# Patient Record
Sex: Male | Born: 1955 | Race: Black or African American | Hispanic: No | Marital: Married | Smoking: Former smoker
Health system: Southern US, Community
[De-identification: ages and names within clinical notes are randomized; demographics above are authoritative.]

## PROBLEM LIST (undated history)

## (undated) DIAGNOSIS — M1990 Unspecified osteoarthritis, unspecified site: Secondary | ICD-10-CM

## (undated) DIAGNOSIS — C801 Malignant (primary) neoplasm, unspecified: Secondary | ICD-10-CM

## (undated) DIAGNOSIS — I639 Cerebral infarction, unspecified: Secondary | ICD-10-CM

## (undated) DIAGNOSIS — I10 Essential (primary) hypertension: Secondary | ICD-10-CM

## (undated) HISTORY — PX: NO PAST SURGERIES: SHX2092

## (undated) HISTORY — DX: Cerebral infarction, unspecified: I63.9

## (undated) HISTORY — DX: Essential (primary) hypertension: I10

---

## 1998-01-16 ENCOUNTER — Emergency Department (HOSPITAL_COMMUNITY): Admission: EM | Admit: 1998-01-16 | Discharge: 1998-01-17 | Payer: Self-pay | Admitting: Emergency Medicine

## 1998-01-19 ENCOUNTER — Encounter: Payer: Self-pay | Admitting: Emergency Medicine

## 1998-01-19 ENCOUNTER — Emergency Department (HOSPITAL_COMMUNITY): Admission: EM | Admit: 1998-01-19 | Discharge: 1998-01-19 | Payer: Self-pay | Admitting: Emergency Medicine

## 1998-01-22 ENCOUNTER — Encounter: Admission: RE | Admit: 1998-01-22 | Discharge: 1998-01-22 | Payer: Self-pay | Admitting: Internal Medicine

## 1998-02-05 ENCOUNTER — Encounter: Admission: RE | Admit: 1998-02-05 | Discharge: 1998-02-05 | Payer: Self-pay | Admitting: Internal Medicine

## 2002-12-12 ENCOUNTER — Emergency Department (HOSPITAL_COMMUNITY): Admission: EM | Admit: 2002-12-12 | Discharge: 2002-12-13 | Payer: Self-pay | Admitting: Emergency Medicine

## 2008-01-20 ENCOUNTER — Emergency Department (HOSPITAL_COMMUNITY): Admission: EM | Admit: 2008-01-20 | Discharge: 2008-01-20 | Payer: Self-pay | Admitting: Emergency Medicine

## 2009-01-08 ENCOUNTER — Emergency Department (HOSPITAL_COMMUNITY): Admission: EM | Admit: 2009-01-08 | Discharge: 2009-01-08 | Payer: Self-pay | Admitting: Emergency Medicine

## 2009-01-26 ENCOUNTER — Emergency Department (HOSPITAL_COMMUNITY): Admission: EM | Admit: 2009-01-26 | Discharge: 2009-01-26 | Payer: Self-pay | Admitting: Emergency Medicine

## 2010-04-18 LAB — POCT I-STAT, CHEM 8
BUN: 9 mg/dL (ref 6–23)
Calcium, Ion: 1.11 mmol/L — ABNORMAL LOW (ref 1.12–1.32)
Chloride: 104 mEq/L (ref 96–112)
Creatinine, Ser: 1.7 mg/dL — ABNORMAL HIGH (ref 0.4–1.5)
Glucose, Bld: 98 mg/dL (ref 70–99)
HCT: 47 % (ref 39.0–52.0)
Hemoglobin: 16 g/dL (ref 13.0–17.0)
Potassium: 3.9 mEq/L (ref 3.5–5.1)
Sodium: 141 mEq/L (ref 135–145)
TCO2: 30 mmol/L (ref 0–100)

## 2010-04-18 LAB — CBC
HCT: 43.6 % (ref 39.0–52.0)
Hemoglobin: 14.8 g/dL (ref 13.0–17.0)
MCHC: 33.9 g/dL (ref 30.0–36.0)
MCV: 86.2 fL (ref 78.0–100.0)
Platelets: 278 10*3/uL (ref 150–400)
RBC: 5.05 MIL/uL (ref 4.22–5.81)
RDW: 14.4 % (ref 11.5–15.5)
WBC: 8.8 10*3/uL (ref 4.0–10.5)

## 2010-04-18 LAB — PROTIME-INR
INR: 0.98 (ref 0.00–1.49)
Prothrombin Time: 12.9 seconds (ref 11.6–15.2)

## 2011-03-28 ENCOUNTER — Other Ambulatory Visit: Payer: Self-pay | Admitting: Family Medicine

## 2011-03-28 ENCOUNTER — Ambulatory Visit (INDEPENDENT_AMBULATORY_CARE_PROVIDER_SITE_OTHER): Payer: 59 | Admitting: Family Medicine

## 2011-03-28 VITALS — BP 133/93 | HR 64 | Temp 98.4°F | Resp 16 | Ht 69.5 in | Wt 263.0 lb

## 2011-03-28 DIAGNOSIS — I10 Essential (primary) hypertension: Secondary | ICD-10-CM

## 2011-03-28 DIAGNOSIS — N529 Male erectile dysfunction, unspecified: Secondary | ICD-10-CM

## 2011-03-28 MED ORDER — LOSARTAN POTASSIUM-HCTZ 100-25 MG PO TABS: 1.0000 | ORAL_TABLET | Freq: Every day | ORAL | Status: DC

## 2011-03-28 MED ORDER — VARDENAFIL HCL 20 MG PO TABS: 20.0000 mg | ORAL_TABLET | Freq: Every day | ORAL | Status: DC | PRN

## 2011-03-28 MED ORDER — AMLODIPINE BESYLATE 10 MG PO TABS: 10.0000 mg | ORAL_TABLET | Freq: Every day | ORAL | Status: DC

## 2011-03-28 NOTE — Patient Instructions (Signed)
Come in for complete physical in about August or September.  Eat less and start doing a daily walk to see if you can lose some weight.

## 2011-03-28 NOTE — Progress Notes (Signed)
Subjective:  Patient is here for a blood pressure recheck. No acute complaints. Needs a refill of his Levitra.  Objective no acute distress throat clear neck supple without nodes or thyromegaly. Chest clear. Heart regular without murmurs. No ankle edema.   Assessment: Hypertension   ED  Plan   meds same

## 2011-04-22 ENCOUNTER — Other Ambulatory Visit: Payer: Self-pay | Admitting: Family Medicine

## 2011-09-22 ENCOUNTER — Other Ambulatory Visit: Payer: Self-pay | Admitting: Family Medicine

## 2011-12-23 ENCOUNTER — Other Ambulatory Visit: Payer: Self-pay | Admitting: Physician Assistant

## 2012-01-01 ENCOUNTER — Ambulatory Visit: Payer: 59 | Admitting: Family Medicine

## 2012-01-01 VITALS — BP 139/83 | HR 82 | Temp 97.9°F | Resp 18 | Ht 69.5 in | Wt 258.0 lb

## 2012-01-01 DIAGNOSIS — I10 Essential (primary) hypertension: Secondary | ICD-10-CM

## 2012-01-01 DIAGNOSIS — M109 Gout, unspecified: Secondary | ICD-10-CM

## 2012-01-01 DIAGNOSIS — N529 Male erectile dysfunction, unspecified: Secondary | ICD-10-CM

## 2012-01-01 LAB — COMPREHENSIVE METABOLIC PANEL
Albumin: 4.3 g/dL (ref 3.5–5.2)
Alkaline Phosphatase: 78 U/L (ref 39–117)
BUN: 12 mg/dL (ref 6–23)
Creat: 1.5 mg/dL — ABNORMAL HIGH (ref 0.50–1.35)
Glucose, Bld: 100 mg/dL — ABNORMAL HIGH (ref 70–99)
Total Bilirubin: 1.1 mg/dL (ref 0.3–1.2)

## 2012-01-01 MED ORDER — VARDENAFIL HCL 20 MG PO TABS: 20.0000 mg | ORAL_TABLET | Freq: Every day | ORAL | Status: DC | PRN

## 2012-01-01 MED ORDER — PREDNISONE 20 MG PO TABS: ORAL_TABLET | ORAL | Status: DC

## 2012-01-01 MED ORDER — INDOMETHACIN 25 MG PO CAPS: 25.0000 mg | ORAL_CAPSULE | Freq: Two times a day (BID) | ORAL | Status: DC

## 2012-01-01 MED ORDER — LOSARTAN POTASSIUM-HCTZ 100-25 MG PO TABS: 1.0000 | ORAL_TABLET | Freq: Every day | ORAL | Status: DC

## 2012-01-01 MED ORDER — AMLODIPINE BESYLATE 10 MG PO TABS: 10.0000 mg | ORAL_TABLET | Freq: Every day | ORAL | Status: DC

## 2012-01-01 NOTE — Progress Notes (Signed)
Subjective: Patient has been having trouble with swelling and pain in his right foot. He has had this several times in the past. He does not know what is causing it. He has the high blood pressure and is on medicine for that. He also has ED and would like a refill of his Levitra.  Objective: No acute complaints. Blood pressure is noted. Chest clear. Heart regular without murmurs. His right foot has a lot of tenderness over the MCP joint of the large toe. There is swelling today.  Assessment: Gout Hypertension Erectile dysfunction  Plan: Refill the lower Levitra Indomethacin and a short course of prednisone. Continue blood pressure medication After he gets the results of his labs we will probably want to put him on allopurinol. He has never been treated for hyperuricemia.

## 2012-01-01 NOTE — Patient Instructions (Addendum)
Continue blood pressure medications  Use the Levitra has necessary. Often one half or even one fourth of a tablet may do the job.  Take the prednisone 2 pills every day for 3 days for the gout. Also begin indomethacin 25 mg every day at breakfast and supper. This is for gout also. Stop it if it upsets her stomach

## 2012-01-05 MED ORDER — ALLOPURINOL 100 MG PO TABS: 100.0000 mg | ORAL_TABLET | Freq: Two times a day (BID) | ORAL | Status: DC

## 2012-01-05 NOTE — Addendum Note (Signed)
Addended by: Johnnette Litter on: 01/05/2012 12:03 PM   Modules accepted: Orders

## 2012-01-09 ENCOUNTER — Other Ambulatory Visit: Payer: Self-pay | Admitting: *Deleted

## 2012-01-09 MED ORDER — ALLOPURINOL 100 MG PO TABS: ORAL_TABLET | ORAL | Status: DC

## 2012-01-14 ENCOUNTER — Other Ambulatory Visit: Payer: Self-pay | Admitting: Radiology

## 2012-05-17 ENCOUNTER — Ambulatory Visit: Payer: 59 | Admitting: Family Medicine

## 2012-05-17 ENCOUNTER — Ambulatory Visit: Payer: 59

## 2012-05-17 VITALS — BP 134/75 | HR 87 | Temp 99.1°F | Resp 16 | Ht 69.3 in | Wt 246.2 lb

## 2012-05-17 DIAGNOSIS — M25559 Pain in unspecified hip: Secondary | ICD-10-CM

## 2012-05-17 DIAGNOSIS — M25552 Pain in left hip: Secondary | ICD-10-CM

## 2012-05-17 DIAGNOSIS — M109 Gout, unspecified: Secondary | ICD-10-CM

## 2012-05-17 LAB — COMPREHENSIVE METABOLIC PANEL
AST: 21 U/L (ref 0–37)
Alkaline Phosphatase: 66 U/L (ref 39–117)
Glucose, Bld: 85 mg/dL (ref 70–99)
Sodium: 138 mEq/L (ref 135–145)
Total Bilirubin: 1.1 mg/dL (ref 0.3–1.2)
Total Protein: 8.1 g/dL (ref 6.0–8.3)

## 2012-05-17 LAB — URIC ACID: Uric Acid, Serum: 7.7 mg/dL (ref 4.0–7.8)

## 2012-05-17 MED ORDER — INDOMETHACIN 25 MG PO CAPS: 25.0000 mg | ORAL_CAPSULE | Freq: Two times a day (BID) | ORAL | Status: DC

## 2012-05-17 MED ORDER — PREDNISONE 20 MG PO TABS: ORAL_TABLET | ORAL | Status: DC

## 2012-05-17 NOTE — Patient Instructions (Addendum)
Take the medications as directed.  Return if not improving  We will let you know the results of your lab

## 2012-05-17 NOTE — Progress Notes (Addendum)
Subjective: Patient is here with left hip pain. He had no trauma. It started hurting about Tuesday. He got pretty bad. He is taking some Aleve and is doing a little bit better. He thinks his gout in the hip this time. He has been taking allopurinol which was begun last fall.  Objective: Walks with a little limp. He is tender in this left hip, primarily in the buttock.  Assessment: Left hip pain, probably gout  Plan: X-ray hip, uric acid level, C. Met Prednisone for 2 days Indomethacin  If symptoms get worse come back  Will let you know the results of your labs  Assessment: Acute gouty arthritis of hip  [facility] reading (PRIMARY) by  Dr. Alwyn Ren Minimal arthritic changes .

## 2012-05-23 ENCOUNTER — Telehealth: Payer: Self-pay

## 2012-05-23 MED ORDER — ALLOPURINOL 300 MG PO TABS: 300.0000 mg | ORAL_TABLET | Freq: Every day | ORAL | Status: DC

## 2012-05-23 NOTE — Telephone Encounter (Signed)
Pt CB and I gave him results of labs and Dr Hopper's instr's. Pt agreed. Sent in new Rx of allopurinol as ordered in lab result notes.

## 2012-05-23 NOTE — Telephone Encounter (Signed)
Lab has been unable to reach, letter was sent. Left message, this is his work # [email or web address]

## 2012-05-23 NOTE — Telephone Encounter (Signed)
Patient says he was advised to call our office about his lab results if he had not heard back from [place]. Please call at [phone].

## 2012-07-06 ENCOUNTER — Other Ambulatory Visit: Payer: Self-pay | Admitting: Family Medicine

## 2012-08-25 ENCOUNTER — Other Ambulatory Visit: Payer: Self-pay | Admitting: Family Medicine

## 2012-09-29 ENCOUNTER — Other Ambulatory Visit: Payer: Self-pay | Admitting: Family Medicine

## 2012-10-11 ENCOUNTER — Telehealth: Payer: Self-pay

## 2012-10-11 MED ORDER — LOSARTAN POTASSIUM-HCTZ 100-25 MG PO TABS: 1.0000 | ORAL_TABLET | Freq: Every day | ORAL | Status: DC

## 2012-10-11 NOTE — Telephone Encounter (Signed)
He needs follow up. What is his plan? He indicates he will come in, he knows he is due. 15 day supply sent until he can come in.

## 2012-10-11 NOTE — Telephone Encounter (Signed)
Patient needs a refill on Losartan. States that his pharmacy sent over an electronic request but they haven't heard anything. [employer]  [phone]

## 2012-10-27 ENCOUNTER — Ambulatory Visit: Payer: 59 | Admitting: Emergency Medicine

## 2012-10-27 VITALS — BP 126/74 | HR 83 | Temp 99.0°F | Resp 17 | Ht 70.0 in | Wt 262.0 lb

## 2012-10-27 DIAGNOSIS — I10 Essential (primary) hypertension: Secondary | ICD-10-CM

## 2012-10-27 DIAGNOSIS — M109 Gout, unspecified: Secondary | ICD-10-CM | POA: Insufficient documentation

## 2012-10-27 DIAGNOSIS — R0683 Snoring: Secondary | ICD-10-CM

## 2012-10-27 MED ORDER — LOSARTAN POTASSIUM-HCTZ 100-25 MG PO TABS: ORAL_TABLET | ORAL | Status: DC

## 2012-10-27 MED ORDER — AMLODIPINE BESYLATE 10 MG PO TABS: ORAL_TABLET | ORAL | Status: DC

## 2012-10-27 NOTE — Progress Notes (Signed)
  Subjective:    Patient ID: John Schaefer, male    DOB: Nov 08, 1955, 57 y.o.   MRN: [id number]  HPI  57 y.o. Male presents to clinic today for check up on BP; told he needed to return for visit for further refills.. Was given 2 weeks at pharmacy. Denies any chest pain , SOB. Needing refills on losartan and amlodipine. BP taken in exam room: 124/84   Refused flu vaccine.  Review of Systems     Objective:   Physical Exam  HEENT exam neck is supple. There is a large keloid on the left. Chest is clear to auscultation and percussion. There is a 2/6 systolic murmur at the base of the heart and along the upper left sternal border. Abdomen is soft nontender      Assessment & Plan:  Bmet was done today and that his last potassium was low at 3.3. We'll go ahead and refill his medication. He declined a flu shot today. He did agree to return to clinic this fall for a physical exam and will schedule this.

## 2012-10-28 LAB — BASIC METABOLIC PANEL
Chloride: 97 mEq/L (ref 96–112)
Potassium: 3.6 mEq/L (ref 3.5–5.3)

## 2012-10-28 LAB — URIC ACID: Uric Acid, Serum: 6 mg/dL (ref 4.0–7.8)

## 2012-10-29 ENCOUNTER — Other Ambulatory Visit: Payer: Self-pay | Admitting: Radiology

## 2012-10-29 DIAGNOSIS — R0683 Snoring: Secondary | ICD-10-CM

## 2012-12-03 ENCOUNTER — Emergency Department (HOSPITAL_COMMUNITY): Payer: 59

## 2012-12-03 ENCOUNTER — Emergency Department (HOSPITAL_COMMUNITY)
Admission: EM | Admit: 2012-12-03 | Discharge: 2012-12-03 | Disposition: A | Discharge status: Home / Self Care | Payer: 59 | Attending: Emergency Medicine | Admitting: Emergency Medicine

## 2012-12-03 ENCOUNTER — Encounter (HOSPITAL_COMMUNITY): Payer: Self-pay | Admitting: Emergency Medicine

## 2012-12-03 ENCOUNTER — Ambulatory Visit: Payer: 59 | Admitting: Emergency Medicine

## 2012-12-03 VITALS — BP 128/82 | HR 104 | Temp 100.2°F | Resp 18 | Ht 70.0 in | Wt 260.0 lb

## 2012-12-03 DIAGNOSIS — N453 Epididymo-orchitis: Secondary | ICD-10-CM | POA: Insufficient documentation

## 2012-12-03 DIAGNOSIS — R300 Dysuria: Secondary | ICD-10-CM

## 2012-12-03 DIAGNOSIS — N451 Epididymitis: Secondary | ICD-10-CM

## 2012-12-03 DIAGNOSIS — N50819 Testicular pain, unspecified: Secondary | ICD-10-CM

## 2012-12-03 DIAGNOSIS — Z792 Long term (current) use of antibiotics: Secondary | ICD-10-CM | POA: Insufficient documentation

## 2012-12-03 DIAGNOSIS — Z8673 Personal history of transient ischemic attack (TIA), and cerebral infarction without residual deficits: Secondary | ICD-10-CM | POA: Insufficient documentation

## 2012-12-03 DIAGNOSIS — N320 Bladder-neck obstruction: Secondary | ICD-10-CM

## 2012-12-03 DIAGNOSIS — N509 Disorder of male genital organs, unspecified: Secondary | ICD-10-CM

## 2012-12-03 DIAGNOSIS — I10 Essential (primary) hypertension: Secondary | ICD-10-CM | POA: Insufficient documentation

## 2012-12-03 DIAGNOSIS — A419 Sepsis, unspecified organism: Secondary | ICD-10-CM

## 2012-12-03 DIAGNOSIS — N390 Urinary tract infection, site not specified: Secondary | ICD-10-CM

## 2012-12-03 DIAGNOSIS — Z79899 Other long term (current) drug therapy: Secondary | ICD-10-CM | POA: Insufficient documentation

## 2012-12-03 DIAGNOSIS — Z87891 Personal history of nicotine dependence: Secondary | ICD-10-CM | POA: Insufficient documentation

## 2012-12-03 LAB — POCT CBC
Granulocyte percent: 83.7 %G — AB (ref 37–80)
Hemoglobin: 15.4 g/dL (ref 14.1–18.1)
Lymph, poc: 2.4 (ref 0.6–3.4)
MCHC: 31.5 g/dL — AB (ref 31.8–35.4)
MPV: 9.4 fL (ref 0–99.8)
POC Granulocyte: 21 — AB (ref 2–6.9)
POC LYMPH PERCENT: 9.4 %L — AB (ref 10–50)
POC MID %: 6.9 %M (ref 0–12)
Platelet Count, POC: 298 10*3/uL (ref 142–424)
RDW, POC: 14.7 %

## 2012-12-03 LAB — COMPREHENSIVE METABOLIC PANEL
ALT: 39 U/L (ref 0–53)
AST: 28 U/L (ref 0–37)
Albumin: 3.4 g/dL — ABNORMAL LOW (ref 3.5–5.2)
Alkaline Phosphatase: 97 U/L (ref 39–117)
BUN: 13 mg/dL (ref 6–23)
Calcium: 9.5 mg/dL (ref 8.4–10.5)
Chloride: 97 mEq/L (ref 96–112)
GFR calc Af Amer: 59 mL/min — ABNORMAL LOW (ref >90)
Potassium: 3.8 mEq/L (ref 3.5–5.1)
Sodium: 135 mEq/L (ref 135–145)
Total Bilirubin: 2.2 mg/dL — ABNORMAL HIGH (ref 0.3–1.2)
Total Protein: 8.3 g/dL (ref 6.0–8.3)

## 2012-12-03 LAB — POCT URINALYSIS DIPSTICK
Bilirubin, UA: NEGATIVE
Glucose, UA: NEGATIVE
Nitrite, UA: POSITIVE

## 2012-12-03 LAB — POCT UA - MICROSCOPIC ONLY

## 2012-12-03 LAB — CG4 I-STAT (LACTIC ACID): Lactic Acid, Venous: 1.65 mmol/L (ref 0.5–2.2)

## 2012-12-03 MED ORDER — DEXTROSE 5 % IV SOLN
1.0000 g | Freq: Once | INTRAVENOUS | Status: AC
  Administered 2012-12-03: 1 g via INTRAVENOUS
  Filled 2012-12-03: qty 10

## 2012-12-03 MED ORDER — DOXYCYCLINE HYCLATE 100 MG PO CAPS: 100.0000 mg | ORAL_CAPSULE | Freq: Two times a day (BID) | ORAL | Status: DC

## 2012-12-03 MED ORDER — CLINDAMYCIN PHOSPHATE 600 MG/50ML IV SOLN
600.0000 mg | Freq: Once | INTRAVENOUS | Status: AC
  Administered 2012-12-03: 600 mg via INTRAVENOUS
  Filled 2012-12-03: qty 50

## 2012-12-03 MED ORDER — HYDROMORPHONE HCL PF 1 MG/ML IJ SOLN
1.0000 mg | Freq: Once | INTRAMUSCULAR | Status: AC
  Administered 2012-12-03: 1 mg via INTRAVENOUS
  Filled 2012-12-03: qty 1

## 2012-12-03 MED ORDER — SODIUM CHLORIDE 0.9 % IV BOLUS (SEPSIS)
1000.0000 mL | Freq: Once | INTRAVENOUS | Status: AC
  Administered 2012-12-03: 1000 mL via INTRAVENOUS

## 2012-12-03 MED ORDER — LEVOFLOXACIN IN D5W 750 MG/150ML IV SOLN: 750.0000 mg | Freq: Once | INTRAVENOUS | Status: DC

## 2012-12-03 MED ORDER — SULFAMETHOXAZOLE-TRIMETHOPRIM 800-160 MG PO TABS: 1.0000 | ORAL_TABLET | Freq: Two times a day (BID) | ORAL | Status: AC

## 2012-12-03 MED ORDER — HYDROCODONE-ACETAMINOPHEN 5-325 MG PO TABS: 2.0000 | ORAL_TABLET | ORAL | Status: DC | PRN

## 2012-12-03 NOTE — ED Notes (Signed)
PT given d/c instructions and verbalized understanding. NAD at this time.

## 2012-12-03 NOTE — ED Notes (Signed)
Pt returned from ultrasound

## 2012-12-03 NOTE — ED Notes (Signed)
Pt on the phone

## 2012-12-03 NOTE — Progress Notes (Signed)
[facility] [address], [place] [address] [phone]  Date:  12/03/2012   Name:  John Schaefer   DOB:  1955-07-25   MRN:  [id number]  PCP:  Janace Hoard, MD    Chief Complaint: genital soreness   History of Present Illness:  John Schaefer is a 57 y.o. very pleasant male patient who presents with the following:  Sudden onset of pain in testicles yesterday with urge incontinence.  No fever or chills.  Has frequent small voids and now his urine is bloody.  Has history of prior prostate enlargement.  No nausea or vomiting.  No discharge.  No improvement with over the counter medications or other home remedies.   Patient Active Problem List   Diagnosis Date Noted  . Unspecified essential hypertension 10/27/2012  . Gout 10/27/2012    Past Medical History  Diagnosis Date  . Stroke   . Blood transfusion without reported diagnosis   . Hypertension     History reviewed. No pertinent past surgical history.  History  Substance Use Topics  . Smoking status: Former Games developer  . Smokeless tobacco: Not on file  . Alcohol Use: No    History reviewed. No pertinent family history.  No Known Allergies  Medication list has been reviewed and updated.  Current Outpatient Prescriptions on File Prior to Visit  Medication Sig Dispense Refill  . allopurinol (ZYLOPRIM) 100 MG tablet Take 2 tablets (200 mg total) by mouth daily.  60 tablet  2  . amLODipine (NORVASC) 10 MG tablet TAKE ONE TABLET BY MOUTH EVERY DAY  30 tablet  5  . indomethacin (INDOCIN) 25 MG capsule Take 1 capsule (25 mg total) by mouth 2 (two) times daily with a meal.  30 capsule  1  . losartan-hydrochlorothiazide (HYZAAR) 100-25 MG per tablet Take one tablet daily  30 tablet  5  . predniSONE (DELTASONE) 20 MG tablet Take 2 daily for 3 days for gout  6 tablet  0  . tadalafil (CIALIS) 5 MG tablet Take 5 mg by mouth daily as needed.      . vardenafil (LEVITRA) 20 MG tablet Take 1 tablet (20 mg total)  by mouth daily as needed.  3 tablet  5   No current facility-administered medications on file prior to visit.    Review of Systems:  As per HPI, otherwise negative.    Physical Examination: Filed Vitals:   12/03/12 1516  BP: 128/82  Pulse: 104  Temp: 100.2 F (37.9 C)  Resp: 18   Filed Vitals:   12/03/12 1516  Height: 5\' 10"  (1.778 m)  Weight: 260 lb (117.935 kg)   Body mass index is 37.31 kg/(m^2). Ideal Body Weight: Weight in (lb) to have BMI = 25: 173.9   GEN: WDWN, NAD, Non-toxic, Alert & Oriented x 3 HEENT: Atraumatic, Normocephalic.  Ears and Nose: No external deformity. EXTR: No clubbing/cyanosis/edema NEURO: Normal gait.  PSYCH: Normally interactive. Conversant. Not depressed or anxious appearing.  Calm demeanor.  Genitalia:  Normal male.  Has bloody urine draining.  Testes tender bilaterally but not enlarged and normal epididymis bilaterally  Assessment and Plan: Urinary outlet obstruction Urinary sepsis   Signed,  Phillips Odor, MD  Results for orders placed in visit on 12/03/12  POCT URINALYSIS DIPSTICK      Result Value Range   Color, UA orange     Clarity, UA cloudy     Glucose, UA neg     Bilirubin, UA neg  Ketones, UA neg     Spec Grav, UA 1.020     Blood, UA large     pH, UA 7.0     Protein, UA >300     Urobilinogen, UA 4.0     Nitrite, UA pos     Leukocytes, UA large (3+)    POCT CBC      Result Value Range   WBC 25.1 (*) 4.6 - 10.2 K/uL   Lymph, poc 2.4  0.6 - 3.4   POC LYMPH PERCENT 9.4 (*) 10 - 50 %L   MID (cbc) 1.7 (*) 0 - 0.9   POC MID % 6.9  0 - 12 %M   POC Granulocyte 21.0 (*) 2 - 6.9   Granulocyte percent 83.7 (*) 37 - 80 %G   RBC 5.35  4.69 - 6.13 M/uL   Hemoglobin 15.4  14.1 - 18.1 g/dL   HCT, POC 16.1  09.6 - 53.7 %   MCV 91.4  80 - 97 fL   MCH, POC 28.8  27 - 31.2 pg   MCHC 31.5 (*) 31.8 - 35.4 g/dL   RDW, POC 04.5     Platelet Count, POC 298  142 - 424 K/uL   MPV 9.4  0 - 99.8 fL

## 2012-12-03 NOTE — ED Notes (Addendum)
Presents with 2 days of urinary frequency, urgency, dysuria and today began urinating blood. Went to ucc and told to come directly here due to WBC of 25 to rule out urosepsis.  Alert, oreinted, afebrile, VSS. Reports pain in groin and penis.

## 2012-12-03 NOTE — ED Provider Notes (Signed)
CSN: [id number]     Arrival date & time 12/03/12  1708 History   First MD Initiated Contact with Patient 12/03/12 1728     Chief Complaint  Patient presents with  . Hematuria   (Consider location/radiation/quality/duration/timing/severity/associated sxs/prior Treatment) The history is provided by the patient. No language interpreter was used.  Patient is a 57 old [place] American male with no pertinent past medical history comes emergency department stay with testicular pain, dysuria, frequency, and urgency for the past 24 hours. He initially went to his primary care physician today for evaluation. His primary care visit to obtain a UA and a CBC. CBC had WBC of 25 and UA. Act as result they sent him to the emergency department for further evaluation. Patient has not had any fevers. He has had chills. Been eating and drinking normally. His only complaint is testicular pain which rates at an 8/10.    Past Medical History  Diagnosis Date  . Stroke   . Blood transfusion without reported diagnosis   . Hypertension    History reviewed. No pertinent past surgical history. History reviewed. No pertinent family history. History  Substance Use Topics  . Smoking status: Former Games developer  . Smokeless tobacco: Not on file  . Alcohol Use: No    Review of Systems  Constitutional: Positive for chills. Negative for fever.  Respiratory: Negative for cough and shortness of breath.   Genitourinary: Positive for dysuria, urgency, frequency and testicular pain. Negative for decreased urine volume, penile swelling, scrotal swelling and penile pain.  Musculoskeletal: Negative for back pain, neck pain and neck stiffness.  Skin: Negative for color change and wound.  All other systems reviewed and are negative.    Allergies  Review of patient's allergies indicates no known allergies.  Home Medications   Current Outpatient Rx  Name  Route  Sig  Dispense  Refill  . allopurinol (ZYLOPRIM) 300 MG tablet    Oral   Take 300 mg by mouth daily.         Marland Kitchen amLODipine (NORVASC) 10 MG tablet      TAKE ONE TABLET BY MOUTH EVERY DAY   30 tablet   5   . indomethacin (INDOCIN) 25 MG capsule   Oral   Take 1 capsule (25 mg total) by mouth 2 (two) times daily with a meal.   30 capsule   1   . losartan-hydrochlorothiazide (HYZAAR) 100-25 MG per tablet   Oral   Take 1 tablet by mouth daily. Take one tablet daily         . vardenafil (LEVITRA) 20 MG tablet   Oral   Take 20 mg by mouth daily as needed for erectile dysfunction.         Marland Kitchen doxycycline (VIBRAMYCIN) 100 MG capsule   Oral   Take 1 capsule (100 mg total) by mouth 2 (two) times daily.   20 capsule   0   . HYDROcodone-acetaminophen (NORCO/VICODIN) 5-325 MG per tablet   Oral   Take 2 tablets by mouth every 4 (four) hours as needed.   30 tablet   0   . sulfamethoxazole-trimethoprim (BACTRIM DS,SEPTRA DS) 800-160 MG per tablet   Oral   Take 1 tablet by mouth 2 (two) times daily.   60 tablet   0    BP 149/80  Pulse 99  Temp(Src) 99 F (37.2 C) (Oral)  Resp 18  Wt 260 lb 1 oz (117.964 kg)  SpO2 93% Physical Exam  Nursing note and vitals  reviewed. Constitutional: He is oriented to person, place, and time. He appears well-developed and well-nourished. No distress.  HENT:  Head: Normocephalic and atraumatic.  Eyes: Pupils are equal, round, and reactive to light.  Neck: Normal range of motion.  Cardiovascular: Normal rate, regular rhythm and normal heart sounds.   Pulmonary/Chest: Effort normal and breath sounds normal. No respiratory distress. He has no decreased breath sounds. He has no wheezes. He has no rhonchi. He has no rales.  Abdominal: Soft. He exhibits no distension. There is no tenderness. There is no rebound and no guarding. Hernia confirmed negative in the right inguinal area and confirmed negative in the left inguinal area.  Genitourinary: Right testis shows tenderness. Right testis shows no mass and no  swelling. Right testis is descended. Cremasteric reflex is not absent on the right side. Left testis shows tenderness. Left testis shows no mass and no swelling. Left testis is descended. Cremasteric reflex is not absent on the left side.  Warmth to bilateral testicles.  Erythema to bilateral testicles.  No induration, fluctuance, or crepitus to the scrotal sac.    Musculoskeletal: He exhibits no edema and no tenderness.  Neurological: He is alert and oriented to person, place, and time. He exhibits normal muscle tone.  Skin: Skin is warm and dry.    ED Course  Procedures (including critical care time) Labs Review Labs Reviewed  COMPREHENSIVE METABOLIC PANEL - Abnormal; Notable for the following:    Glucose, Bld 128 (*)    Creatinine, Ser 1.49 (*)    Albumin 3.4 (*)    Total Bilirubin 2.2 (*)    GFR calc non Af Amer 51 (*)    GFR calc Af Amer 59 (*)    All other components within normal limits  CULTURE, BLOOD (ROUTINE X 2)  CULTURE, BLOOD (ROUTINE X 2)  URINE CULTURE  GC/CHLAMYDIA PROBE AMP  CG4 I-STAT (LACTIC ACID)   Imaging Review US Scrotum  12/03/2012   CLINICAL DATA:  Bilateral testicular pain  EXAM: SCROTAL ULTRASOUND  DOPPLER ULTRASOUND OF THE TESTICLES  TECHNIQUE: Complete ultrasound examination of the testicles, epididymis, and other scrotal structures was performed. Color and spectral Doppler ultrasound were also utilized to evaluate blood flow to the testicles.  COMPARISON:  None.  FINDINGS: Right testicle  Measurements: 4.7 x 3.2 x 2.8 cm. No mass or microlithiasis visualized.  Left testicle  Measurements: 3.7 x 3.3 x 3.2 cm. No mass or microlithiasis visualized.  No intratesticular abnormality or difference in echogenicity is identified.  Right epididymis: Tiny sub cm simple appearing epididymal cysts are incidentally noted.  Left epididymis: Enlarged and inhomogeneous, with increased internal color flow, measuring overall 3.5 x 3.8 x 2.4 cm. Tiny sub cm simple appearing cyst  are incidentally noted.  Hydrocele:  None visualized.  Varicocele:  Small left varicocele  Pulsed Doppler interrogation of both testes demonstrates low resistance arterial and venous waveforms bilaterally.  IMPRESSION: Left epididymitis.  No sonographic evidence for testicular mass or torsion.   Electronically Signed   By: Christiana Pellant M.D.   On: 12/03/2012 20:12   [place] Art/ven Flow Abd Pelv Doppler Limited  12/03/2012   CLINICAL DATA:  Bilateral testicular pain  EXAM: SCROTAL ULTRASOUND  DOPPLER ULTRASOUND OF THE TESTICLES  TECHNIQUE: Complete ultrasound examination of the testicles, epididymis, and other scrotal structures was performed. Color and spectral Doppler ultrasound were also utilized to evaluate blood flow to the testicles.  COMPARISON:  None.  FINDINGS: Right testicle  Measurements: 4.7 x 3.2 x 2.8  cm. No mass or microlithiasis visualized.  Left testicle  Measurements: 3.7 x 3.3 x 3.2 cm. No mass or microlithiasis visualized.  No intratesticular abnormality or difference in echogenicity is identified.  Right epididymis: Tiny sub cm simple appearing epididymal cysts are incidentally noted.  Left epididymis: Enlarged and inhomogeneous, with increased internal color flow, measuring overall 3.5 x 3.8 x 2.4 cm. Tiny sub cm simple appearing cyst are incidentally noted.  Hydrocele:  None visualized.  Varicocele:  Small left varicocele  Pulsed Doppler interrogation of both testes demonstrates low resistance arterial and venous waveforms bilaterally.  IMPRESSION: Left epididymitis.  No sonographic evidence for testicular mass or torsion.   Electronically Signed   By: Christiana Pellant M.D.   On: 12/03/2012 20:12    EKG Interpretation   None       MDM  Patient is a 57 year-old [place] American male who comes to the emergency department today with testicular pain. Physical exam as above. Exam is concerning for an epididymitis. Doubt a testicular torsion based on exam. Initial workup included a lactic  acid, CMP, blood cultures, and testicular ultrasound. Patient was treated with Rocephin and clindamycin in the emergency department. There is erythema to the scrotum concerning for possible cellulitis versus Fournier's of the scrotum. Ultrasound demonstrated a left epididymitis. Care was discussed with urology they felt that erythema and warmth to the scrotum was normal with a epididymitis. They felt that since there is no induration, fluctuance, or crepitus to the scrotal sac it is unlikely the patient's suffering from cellulitis or Fournier's. They recommended that a postvoid residual be obtained and that the patient be started on Flomax if he has a postvoid residual greater than 150. Post void residual was 80.  Tachycardia an elevated WBC is concerning for sepsis. Patient's vital signs are repeated after the pain was controlled. Demonstrated a pulse in the 80s. He remained stable in the emergency department. Patient was nontoxic-appearing. With normal vital signs. Doubt sepsis. Patient was felt to be stable for discharge. He has had multiple sexual partners. As results was felt to require treatment for STDs and gram-negative rods. Neurology recommended treating with Bactrim for a month for gram-negative rods. Patient was instructed to followup with his primary care physician within the next 2-3 days. He was instructed to return to emergency department if he develops worsening pain, fevers, or any other concerns. Patient expressed understanding. He was discharged in stable condition.  His care was discussed with my attending Dr. Manus Gunning.    1. Epididymitis        Bethann Berkshire, MD 12/03/12 2119

## 2012-12-04 LAB — URINE CULTURE: Culture: NO GROWTH

## 2012-12-04 LAB — GC/CHLAMYDIA PROBE AMP: GC Probe RNA: NEGATIVE

## 2012-12-04 NOTE — ED Provider Notes (Signed)
I saw and evaluated the patient, reviewed the resident's note and I agree with the findings and plan. If applicable, I agree with the resident's interpretation of the EKG.  If applicable, I was present for critical portions of any procedures performed.  Bilateral testicular pain with dysuria, urgency.  No abdominal pain, vomiting, fever, trauma. Bilateral testicles warm and tender.  No induration or fluctuance. D/w urology who agrees with treatment for epididymitis.  No torsion.  No evidence of Fournier's gangrene.  Glynn Octave, MD 12/04/12 [phone]

## 2012-12-09 LAB — CULTURE, BLOOD (ROUTINE X 2)
Culture: NO GROWTH
Culture: NO GROWTH

## 2013-01-04 ENCOUNTER — Other Ambulatory Visit: Payer: Self-pay | Admitting: Family Medicine

## 2013-02-08 ENCOUNTER — Other Ambulatory Visit: Payer: Self-pay | Admitting: Family Medicine

## 2013-05-03 ENCOUNTER — Other Ambulatory Visit: Payer: Self-pay | Admitting: Emergency Medicine

## 2013-05-04 NOTE — Telephone Encounter (Signed)
Needs office visit.

## 2013-05-17 ENCOUNTER — Other Ambulatory Visit: Payer: Self-pay | Admitting: Emergency Medicine

## 2013-05-21 ENCOUNTER — Other Ambulatory Visit: Payer: Self-pay | Admitting: Emergency Medicine

## 2013-06-06 ENCOUNTER — Other Ambulatory Visit: Payer: Self-pay | Admitting: Emergency Medicine

## 2013-06-06 ENCOUNTER — Other Ambulatory Visit: Payer: Self-pay | Admitting: Physician Assistant

## 2013-07-10 ENCOUNTER — Other Ambulatory Visit: Payer: Self-pay | Admitting: Physician Assistant

## 2013-07-12 ENCOUNTER — Other Ambulatory Visit: Payer: Self-pay | Admitting: Physician Assistant

## 2013-07-14 ENCOUNTER — Other Ambulatory Visit: Payer: Self-pay | Admitting: Physician Assistant

## 2013-07-14 NOTE — Telephone Encounter (Signed)
Pt is aware that he will need an OV for additional refills on BP medication, he would like to know if he could have enough called in to last him until he is able to come in Saturday 07/19/13. Best# [phone]

## 2013-07-20 ENCOUNTER — Ambulatory Visit (INDEPENDENT_AMBULATORY_CARE_PROVIDER_SITE_OTHER): Payer: 59 | Admitting: Family Medicine

## 2013-07-20 VITALS — BP 138/90 | HR 85 | Temp 98.2°F | Resp 18 | Ht 69.0 in | Wt 264.0 lb

## 2013-07-20 DIAGNOSIS — I10 Essential (primary) hypertension: Secondary | ICD-10-CM

## 2013-07-20 DIAGNOSIS — M1A00X0 Idiopathic chronic gout, unspecified site, without tophus (tophi): Secondary | ICD-10-CM

## 2013-07-20 DIAGNOSIS — Z79899 Other long term (current) drug therapy: Secondary | ICD-10-CM

## 2013-07-20 DIAGNOSIS — R011 Cardiac murmur, unspecified: Secondary | ICD-10-CM

## 2013-07-20 MED ORDER — LOSARTAN POTASSIUM-HCTZ 100-25 MG PO TABS: 1.0000 | ORAL_TABLET | Freq: Every day | ORAL | Status: DC

## 2013-07-20 MED ORDER — AMLODIPINE BESYLATE 10 MG PO TABS: 10.0000 mg | ORAL_TABLET | Freq: Every day | ORAL | Status: DC

## 2013-07-20 MED ORDER — ALLOPURINOL 300 MG PO TABS: 300.0000 mg | ORAL_TABLET | Freq: Every day | ORAL | Status: DC

## 2013-07-20 NOTE — Progress Notes (Signed)
Subjective:    Patient ID: John Schaefer, male    DOB: 1955-09-27, 58 y.o.   MRN: [id number] Chief Complaint  Patient presents with  . Medication Refill    Amlodipine, losartin    HPI Checks BP outside office and about the same   Pt is upset because he ran out of BP medication a wk ago and he called here and his pharmacy mult times and they didn't get refilled to last him until he can get in for a visit even though someone told him they would.  Works so has a hard time getting in. No h/o murmur.   Past Medical History  Diagnosis Date  . Stroke   . Blood transfusion without reported diagnosis   . Hypertension    Current Outpatient Prescriptions on File Prior to Visit  Medication Sig Dispense Refill  . indomethacin (INDOCIN) 25 MG capsule Take 1 capsule (25 mg total) by mouth 2 (two) times daily with a meal.  30 capsule  1   No current facility-administered medications on file prior to visit.   No Known Allergies   Review of Systems  Constitutional: Negative for fever and chills.  Eyes: Negative for visual disturbance.  Respiratory: Negative for apnea, chest tightness and shortness of breath.        No orthopnea or PND.  Cardiovascular: Negative for chest pain, palpitations and leg swelling.  Neurological: Negative for dizziness, syncope, facial asymmetry, weakness, light-headedness and headaches.  Psychiatric/Behavioral: Negative for sleep disturbance.      BP 138/90  Pulse 85  Temp(Src) 98.2 F (36.8 C)  Resp 18  Ht 5\' 9"  (1.753 m)  Wt 264 lb (119.75 kg)  BMI 38.97 kg/m2  SpO2 95% Objective:   Physical Exam  Constitutional: He is oriented to person, place, and time. He appears well-developed and well-nourished. No distress.  HENT:  Head: Normocephalic and atraumatic.  Eyes: Conjunctivae are normal. Pupils are equal, round, and reactive to light. No scleral icterus.  Neck: Normal range of motion. Neck supple. No thyromegaly present.  Cardiovascular: Normal rate,  regular rhythm and intact distal pulses.   Murmur heard.  Decrescendo systolic murmur is present with a grade of 2/6  RUSB 2+ pitting edema bilateral, no sacral edema  Pulmonary/Chest: Effort normal and breath sounds normal. No respiratory distress.  Lymphadenopathy:    He has no cervical adenopathy.  Neurological: He is alert and oriented to person, place, and time.  Skin: Skin is warm and dry. He is not diaphoretic.  Psychiatric: He has a normal mood and affect. His behavior is normal.       Assessment & Plan:   Unspecified essential hypertension - restart amlodipine and losartan-hctz. Pt is overdue for fasting labs and CPE so rec f/u OV for this and to recheck BP.  Idiopathic chronic gout of right foot without tophus - Plan: Uric Acid - refill allopurinol  Encounter for long-term (current) use of other medications - Plan: Basic metabolic panel  Undiagnosed cardiac murmurs - new w/ pedal edema but asymptomatic - recheck at f/u and refer for echo is persits. RTC immed if sxs.  Meds ordered this encounter  Medications  . amLODipine (NORVASC) 10 MG tablet    Sig: Take 1 tablet (10 mg total) by mouth daily.    Dispense:  90 tablet    Refill:  1  . losartan-hydrochlorothiazide (HYZAAR) 100-25 MG per tablet    Sig: Take 1 tablet by mouth daily.    Dispense:  90  tablet    Refill:  1  . allopurinol (ZYLOPRIM) 300 MG tablet    Sig: Take 1 tablet (300 mg total) by mouth daily.    Dispense:  90 tablet    Refill:  1    Delman Cheadle, MD MPH

## 2013-07-20 NOTE — Patient Instructions (Signed)
Hypertension Hypertension, commonly called high blood pressure, is when the force of blood pumping through your arteries is too strong. Your arteries are the blood vessels that carry blood from your heart throughout your body. A blood pressure reading consists of a higher number over a lower number, such as 110/72. The higher number (systolic) is the pressure inside your arteries when your heart pumps. The lower number (diastolic) is the pressure inside your arteries when your heart relaxes. Ideally you want your blood pressure below 120/80. Hypertension forces your heart to work harder to pump blood. Your arteries may become narrow or stiff. Having hypertension puts you at risk for heart disease, stroke, and other problems.  RISK FACTORS Some risk factors for high blood pressure are controllable. Others are not.  Risk factors you cannot control include:   Race. You may be at higher risk if you are African American.  Age. Risk increases with age.  Gender. Men are at higher risk than women before age 45 years. After age 65, women are at higher risk than men. Risk factors you can control include:  Not getting enough exercise or physical activity.  Being overweight.  Getting too much fat, sugar, calories, or salt in your diet.  Drinking too much alcohol. SIGNS AND SYMPTOMS Hypertension does not usually cause signs or symptoms. Extremely high blood pressure (hypertensive crisis) may cause headache, anxiety, shortness of breath, and nosebleed. DIAGNOSIS  To check if you have hypertension, your health care provider will measure your blood pressure while you are seated, with your arm held at the level of your heart. It should be measured at least twice using the same arm. Certain conditions can cause a difference in blood pressure between your right and left arms. A blood pressure reading that is higher than normal on one occasion does not mean that you need treatment. If one blood pressure reading  is high, ask your health care provider about having it checked again. TREATMENT  Treating high blood pressure includes making lifestyle changes and possibly taking medication. Living a healthy lifestyle can help lower high blood pressure. You may need to change some of your habits. Lifestyle changes may include:  Following the DASH diet. This diet is high in fruits, vegetables, and whole grains. It is low in salt, red meat, and added sugars.  Getting at least 2 1/2 hours of brisk physical activity every week.  Losing weight if necessary.  Not smoking.  Limiting alcoholic beverages.  Learning ways to reduce stress. If lifestyle changes are not enough to get your blood pressure under control, your health care provider may prescribe medicine. You may need to take more than one. Work closely with your health care provider to understand the risks and benefits. HOME CARE INSTRUCTIONS  Have your blood pressure rechecked as directed by your health care provider.   Only take medicine as directed by your health care provider. Follow the directions carefully. Blood pressure medicines must be taken as prescribed. The medicine does not work as well when you skip doses. Skipping doses also puts you at risk for problems.   Do not smoke.   Monitor your blood pressure at home as directed by your health care provider. SEEK MEDICAL CARE IF:   You think you are having a reaction to medicines taken.  You have recurrent headaches or feel dizzy.  You have swelling in your ankles.  You have trouble with your vision. SEEK IMMEDIATE MEDICAL CARE IF:  You develop a severe headache or   confusion.  You have unusual weakness, numbness, or feel faint.  You have severe chest or abdominal pain.  You vomit repeatedly.  You have trouble breathing. MAKE SURE YOU:   Understand these instructions.  Will watch your condition.  Will get help right away if you are not doing well or get  worse. Document Released: 01/02/2005 Document Revised: 01/07/2013 Document Reviewed: 10/25/2012 [facility] Patient Information 2015 [place], [place]. This information is not intended to replace advice given to you by your health care provider. Make sure you discuss any questions you have with your health care provider. Managing Your High Blood Pressure Blood pressure is a measurement of how forceful your blood is pressing against the walls of the arteries. Arteries are muscular tubes within the circulatory system. Blood pressure does not stay the same. Blood pressure rises when you are active, excited, or nervous; and it lowers during sleep and relaxation. If the numbers measuring your blood pressure stay above normal most of the time, you are at risk for health problems. High blood pressure (hypertension) is a long-term (chronic) condition in which blood pressure is elevated. A blood pressure reading is recorded as two numbers, such as 120 over 80 (or 120/80). The first, higher number is called the systolic pressure. It is a measure of the pressure in your arteries as the heart beats. The second, lower number is called the diastolic pressure. It is a measure of the pressure in your arteries as the heart relaxes between beats.  Keeping your blood pressure in a normal range is important to your overall health and prevention of health problems, such as heart disease and stroke. When your blood pressure is uncontrolled, your heart has to work harder than normal. High blood pressure is a very common condition in adults because blood pressure tends to rise with age. Men and women are equally likely to have hypertension but at different times in life. Before age 24, men are more likely to have hypertension. After 57 years of age, women are more likely to have it. Hypertension is especially common in African Americans. This condition often has no signs or symptoms. The cause of the condition is usually not known. Your  caregiver can help you come up with a plan to keep your blood pressure in a normal, healthy range. BLOOD PRESSURE STAGES Blood pressure is classified into four stages: normal, prehypertension, stage 1, and stage 2. Your blood pressure reading will be used to determine what type of treatment, if any, is necessary. Appropriate treatment options are tied to these four stages:  Normal  Systolic pressure (mm Hg): below 120.  Diastolic pressure (mm Hg): below 80. Prehypertension  Systolic pressure (mm Hg): 120 to 139.  Diastolic pressure (mm Hg): 80 to 89. Stage1  Systolic pressure (mm Hg): 140 to 159.  Diastolic pressure (mm Hg): 90 to 99. Stage2  Systolic pressure (mm Hg): 160 or above.  Diastolic pressure (mm Hg): 100 or above. RISKS RELATED TO HIGH BLOOD PRESSURE Managing your blood pressure is an important responsibility. Uncontrolled high blood pressure can lead to:  A heart attack.  A stroke.  A weakened blood vessel (aneurysm).  Heart failure.  Kidney damage.  Eye damage.  Metabolic syndrome.  Memory and concentration problems. HOW TO MANAGE YOUR BLOOD PRESSURE Blood pressure can be managed effectively with lifestyle changes and medicines (if needed). Your caregiver will help you come up with a plan to bring your blood pressure within a normal range. Your plan should include the following:  Education  Read all information provided by your caregivers about how to control blood pressure.  Educate yourself on the latest guidelines and treatment recommendations. New research is always being done to further define the risks and treatments for high blood pressure. Lifestylechanges  Control your weight.  Avoid smoking.  Stay physically active.  Reduce the amount of salt in your diet.  Reduce stress.  Control any chronic conditions, such as high cholesterol or diabetes.  Reduce your alcohol intake. Medicines  Several medicines (antihypertensive medicines)  are available, if needed, to bring blood pressure within a normal range. Communication  Review all the medicines you take with your caregiver because there may be side effects or interactions.  Talk with your caregiver about your diet, exercise habits, and other lifestyle factors that may be contributing to high blood pressure.  See your caregiver regularly. Your caregiver can help you create and adjust your plan for managing high blood pressure. RECOMMENDATIONS FOR TREATMENT AND FOLLOW-UP  The following recommendations are based on current guidelines for managing high blood pressure in nonpregnant adults. Use these recommendations to identify the proper follow-up period or treatment option based on your blood pressure reading. You can discuss these options with your caregiver.  Systolic pressure of 353 to 614 or diastolic pressure of 80 to 89: Follow up with your caregiver as directed.  Systolic pressure of 431 to 540 or diastolic pressure of 90 to 100: Follow up with your caregiver within 2 months.  Systolic pressure above 086 or diastolic pressure above 761: Follow up with your caregiver within 1 month.  Systolic pressure above 950 or diastolic pressure above 932: Consider antihypertensive therapy; follow up with your caregiver within 1 week.  Systolic pressure above 671 or diastolic pressure above 245: Begin antihypertensive therapy; follow up with your caregiver within 1 week. Document Released: 09/27/2011 Document Reviewed: 09/27/2011 [facility] Patient Information 2015 [place]. This information is not intended to replace advice given to you by your health care provider. Make sure you discuss any questions you have with your health care provider.

## 2013-07-21 LAB — BASIC METABOLIC PANEL
BUN: 8 mg/dL (ref 6–23)
CHLORIDE: 103 meq/L (ref 96–112)
CO2: 28 mEq/L (ref 19–32)
Calcium: 9.8 mg/dL (ref 8.4–10.5)
Creat: 1.34 mg/dL (ref 0.50–1.35)
Glucose, Bld: 120 mg/dL — ABNORMAL HIGH (ref 70–99)
POTASSIUM: 3.7 meq/L (ref 3.5–5.3)
Sodium: 139 mEq/L (ref 135–145)

## 2013-07-21 LAB — URIC ACID: Uric Acid, Serum: 5.6 mg/dL (ref 4.0–7.8)

## 2013-07-22 NOTE — Progress Notes (Signed)
Spoke to patient.  He declined scheduling an appointment stating that it is more convenient for him to come to walk in clinic. He will follow up in 3 months.

## 2013-07-25 ENCOUNTER — Encounter: Payer: Self-pay | Admitting: Family Medicine

## 2013-11-13 ENCOUNTER — Other Ambulatory Visit: Payer: Self-pay | Admitting: Family Medicine

## 2014-01-26 ENCOUNTER — Telehealth: Payer: Self-pay

## 2014-01-26 NOTE — Telephone Encounter (Signed)
Patient requesting a refill on his medications "allopurinol", "Losartan-Hydrochlorothiazide" and his Gout medication. Patient requesting them to be sent to [facility]. I informed patient he may be required to come in for an office visit since he was last seen in July 2015. Patient stated he has new Therapist, nutritional and has not received his card and wouldn't be able to come in till he receives it. Patients call back  Number is [phone]

## 2014-01-27 MED ORDER — LOSARTAN POTASSIUM-HCTZ 100-25 MG PO TABS: 1.0000 | ORAL_TABLET | Freq: Every day | ORAL | Status: DC

## 2014-01-27 MED ORDER — ALLOPURINOL 300 MG PO TABS: 300.0000 mg | ORAL_TABLET | Freq: Every day | ORAL | Status: DC

## 2014-01-27 MED ORDER — AMLODIPINE BESYLATE 10 MG PO TABS: 10.0000 mg | ORAL_TABLET | Freq: Every day | ORAL | Status: DC

## 2014-01-27 NOTE — Telephone Encounter (Signed)
Sent RFs after clarifying which meds needed. Pt agrees to RTC w/in the month.

## 2014-02-20 ENCOUNTER — Ambulatory Visit (INDEPENDENT_AMBULATORY_CARE_PROVIDER_SITE_OTHER): Payer: No Typology Code available for payment source | Admitting: Family Medicine

## 2014-02-20 VITALS — BP 136/84 | HR 88 | Temp 98.5°F | Resp 18 | Ht 69.0 in | Wt 270.8 lb

## 2014-02-20 DIAGNOSIS — M1A9XX0 Chronic gout, unspecified, without tophus (tophi): Secondary | ICD-10-CM | POA: Insufficient documentation

## 2014-02-20 DIAGNOSIS — M1A00X0 Idiopathic chronic gout, unspecified site, without tophus (tophi): Secondary | ICD-10-CM

## 2014-02-20 DIAGNOSIS — I10 Essential (primary) hypertension: Secondary | ICD-10-CM

## 2014-02-20 DIAGNOSIS — N529 Male erectile dysfunction, unspecified: Secondary | ICD-10-CM

## 2014-02-20 MED ORDER — LOSARTAN POTASSIUM-HCTZ 100-25 MG PO TABS: ORAL_TABLET | ORAL | Status: DC

## 2014-02-20 MED ORDER — VARDENAFIL HCL 20 MG PO TABS: ORAL_TABLET | ORAL | Status: DC

## 2014-02-20 MED ORDER — AMLODIPINE BESYLATE 10 MG PO TABS: ORAL_TABLET | ORAL | Status: DC

## 2014-02-20 MED ORDER — ALLOPURINOL 300 MG PO TABS: ORAL_TABLET | ORAL | Status: DC

## 2014-02-20 NOTE — Progress Notes (Addendum)
Subjective:  02/24/14 Somehow failed to complete note at visit. Do not recall the details of the visit at this time.  Assessment: Gout ED HTN  Plan: See instructions.

## 2014-02-20 NOTE — Patient Instructions (Addendum)
Take the blood pressure and gout medications regularly  Use the Levitra only when needed for anticipated intercourse  Return late this year for follow-up  Work hard on weight loss by regular exercise and eating less

## 2014-02-20 NOTE — Addendum Note (Signed)
Addended byGrant Fontana R on: 02/20/2014 05:55 PM   Modules accepted: Orders

## 2014-02-21 LAB — COMPLETE METABOLIC PANEL WITH GFR
ALBUMIN: 3.9 g/dL (ref 3.5–5.2)
ALT: 32 U/L (ref 0–53)
AST: 19 U/L (ref 0–37)
Alkaline Phosphatase: 87 U/L (ref 39–117)
BUN: 9 mg/dL (ref 6–23)
CO2: 33 mEq/L — ABNORMAL HIGH (ref 19–32)
Calcium: 10.2 mg/dL (ref 8.4–10.5)
Chloride: 96 mEq/L (ref 96–112)
Creat: 1.36 mg/dL — ABNORMAL HIGH (ref 0.50–1.35)
GFR, Est African American: 66 mL/min
GFR, Est Non African American: 57 mL/min — ABNORMAL LOW
Glucose, Bld: 207 mg/dL — ABNORMAL HIGH (ref 70–99)
Potassium: 3.7 mEq/L (ref 3.5–5.3)
SODIUM: 136 meq/L (ref 135–145)
TOTAL PROTEIN: 7.7 g/dL (ref 6.0–8.3)
Total Bilirubin: 0.8 mg/dL (ref 0.2–1.2)

## 2014-02-21 LAB — URIC ACID: Uric Acid, Serum: 5.5 mg/dL (ref 4.0–7.8)

## 2015-04-02 ENCOUNTER — Other Ambulatory Visit: Payer: Self-pay | Admitting: Family Medicine

## 2015-05-11 ENCOUNTER — Other Ambulatory Visit: Payer: Self-pay | Admitting: Family Medicine

## 2015-05-26 ENCOUNTER — Other Ambulatory Visit: Payer: Self-pay | Admitting: Family Medicine

## 2015-05-27 NOTE — Telephone Encounter (Signed)
Called John Schaefer and advised that he needs f/up for RFs. John Schaefer stated that he had not been told this by pharm when we put notices on last couple of RFs. He agreed to come and see Dr Linna Darner on the 15th. I will give him 1 wk RF of each med until then.

## 2015-05-31 ENCOUNTER — Ambulatory Visit (INDEPENDENT_AMBULATORY_CARE_PROVIDER_SITE_OTHER): Payer: BLUE CROSS/BLUE SHIELD | Admitting: Family Medicine

## 2015-05-31 VITALS — BP 135/80 | HR 72 | Temp 98.2°F | Resp 16 | Ht 70.0 in | Wt 261.0 lb

## 2015-05-31 DIAGNOSIS — N529 Male erectile dysfunction, unspecified: Secondary | ICD-10-CM | POA: Diagnosis not present

## 2015-05-31 DIAGNOSIS — E790 Hyperuricemia without signs of inflammatory arthritis and tophaceous disease: Secondary | ICD-10-CM | POA: Diagnosis not present

## 2015-05-31 DIAGNOSIS — E119 Type 2 diabetes mellitus without complications: Secondary | ICD-10-CM

## 2015-05-31 DIAGNOSIS — I10 Essential (primary) hypertension: Secondary | ICD-10-CM

## 2015-05-31 DIAGNOSIS — R739 Hyperglycemia, unspecified: Secondary | ICD-10-CM | POA: Diagnosis not present

## 2015-05-31 LAB — POCT GLYCOSYLATED HEMOGLOBIN (HGB A1C): Hemoglobin A1C: 7.4

## 2015-05-31 LAB — GLUCOSE, POCT (MANUAL RESULT ENTRY): POC Glucose: 99 mg/dl (ref 70–99)

## 2015-05-31 MED ORDER — ALLOPURINOL 300 MG PO TABS: 300.0000 mg | ORAL_TABLET | Freq: Every day | ORAL | Status: DC

## 2015-05-31 MED ORDER — METFORMIN HCL 500 MG PO TABS: 500.0000 mg | ORAL_TABLET | Freq: Every day | ORAL | Status: DC

## 2015-05-31 MED ORDER — VARDENAFIL HCL 20 MG PO TABS: ORAL_TABLET | ORAL | Status: DC

## 2015-05-31 MED ORDER — AMLODIPINE BESYLATE 10 MG PO TABS: 10.0000 mg | ORAL_TABLET | Freq: Every day | ORAL | Status: DC

## 2015-05-31 MED ORDER — LOSARTAN POTASSIUM-HCTZ 100-25 MG PO TABS: 1.0000 | ORAL_TABLET | Freq: Every day | ORAL | Status: DC

## 2015-05-31 MED ORDER — SILDENAFIL CITRATE 20 MG PO TABS: 20.0000 mg | ORAL_TABLET | Freq: Three times a day (TID) | ORAL | Status: DC

## 2015-05-31 NOTE — Progress Notes (Signed)
Patient ID: John Schaefer, male    DOB: 08-24-1955  Age: 60 y.o. MRN: [id number]  Chief Complaint  Patient presents with  . Medication Refill    all meds and wants a cheaper med for  Levitra 20 mg     Subjective:   Patient is here for a refill of his medications. He takes his blood pressure medicine faithfully. He has no major complaints regarding that.  He would like something cheaper than the Levitra. We discussed options. I checked some pharmacies and found that sildenafil at [facility] was the cheapest in the comparison list online.  His blood sugar was high last year. He says that because he had eaten a lot of sweets for coming in. There is a history of diabetes and a brother of his.  Current allergies, medications, problem list, past/family and social histories reviewed.  Objective:  BP 135/80 mmHg  Pulse 72  Temp(Src) 98.2 F (36.8 C) (Oral)  Resp 16  Ht 5\' 10"  (1.778 m)  Wt 261 lb (118.389 kg)  BMI 37.45 kg/m2  SpO2 93%  No major acute distress. Moderately overweight. Throat clear. Neck supple without nodes thyromegaly. Chest clear. Heart regular without murmurs. Abdomen soft nontender. No ankle edema.  Assessment & Plan:   Assessment: 1. Erectile dysfunction, unspecified erectile dysfunction type   2. Essential hypertension   3. Hyperglycemia   4. Hyperuricemia   5. Type 2 diabetes mellitus without complication, without long-term current use of insulin (HCC)       Plan: Check labs.  Orders Placed This Encounter  Procedures  . Comprehensive metabolic panel  . Lipid panel  . POCT glucose (manual entry)  . POCT glycosylated hemoglobin (Hb A1C)    Meds ordered this encounter  Medications  . losartan-hydrochlorothiazide (HYZAAR) 100-25 MG tablet    Sig: Take 1 tablet by mouth daily.    Dispense:  90 tablet    Refill:  3  . amLODipine (NORVASC) 10 MG tablet    Sig: Take 1 tablet (10 mg total) by mouth daily.    Dispense:  90 tablet    Refill:  3  .  allopurinol (ZYLOPRIM) 300 MG tablet    Sig: Take 1 tablet (300 mg total) by mouth daily.    Dispense:  90 tablet    Refill:  3  . vardenafil (LEVITRA) 20 MG tablet    Sig: Use 1/2-1 pill as needed    Dispense:  10 tablet    Refill:  5  . sildenafil (REVATIO) 20 MG tablet    Sig: Take 1 tablet (20 mg total) by mouth 3 (three) times daily.    Dispense:  30 tablet    Refill:  5  . metFORMIN (GLUCOPHAGE) 500 MG tablet    Sig: Take 1 tablet (500 mg total) by mouth daily with breakfast.    Dispense:  90 tablet    Refill:  1   Results for orders placed or performed in visit on 05/31/15  POCT glucose (manual entry)  Result Value Ref Range   POC Glucose 99 70 - 99 mg/dl  POCT glycosylated hemoglobin (Hb A1C)  Result Value Ref Range   Hemoglobin A1C 7.4   ]  Consistent with being diabetic. Explained this to him. See instructions.    Patient Instructions   Take metformin 500 mg 1 each morning at breakfast  Continue your other blood pressure medications  Take the sildenafil 20 mg pills 2 pills up to a maximum of 5 pills one  hour prior to anticipated intercourse. You can see whether you prefer this or using the more expensive Levitra.  Plan to return in 3 months for a recheck, sooner if problems  Read as much as you can and study the American diabetic Association website. Diabetes.org    IF you received an x-ray today, you will receive an invoice from [facility]. Please contact [facility] at [phone] with questions or concerns regarding your invoice.   IF you received labwork today, you will receive an invoice from [employer]. Please contact [facility] at [phone] with questions or concerns regarding your invoice.   Our billing staff will not be able to assist you with questions regarding bills from these companies.  You will be contacted with the lab results as soon as they are available. The fastest way to get your results  is to activate your My Chart account. Instructions are located on the last page of this paperwork. If you have not heard from [place] regarding the results in 2 weeks, please contact this office.         Return in about 3 months (around 08/31/2015).   Taegan Standage, MD 05/31/2015

## 2015-05-31 NOTE — Patient Instructions (Addendum)
Take metformin 500 mg 1 each morning at breakfast  Continue your other blood pressure medications  Take the sildenafil 20 mg pills 2 pills up to a maximum of 5 pills one hour prior to anticipated intercourse. You can see whether you prefer this or using the more expensive Levitra.  Plan to return in 3 months for a recheck, sooner if problems  Read as much as you can and study the American diabetic Association website. Diabetes.org    IF you received an x-ray today, you will receive an invoice from [facility]. Please contact [facility] at [phone] with questions or concerns regarding your invoice.   IF you received labwork today, you will receive an invoice from [employer]. Please contact [facility] at [phone] with questions or concerns regarding your invoice.   Our billing staff will not be able to assist you with questions regarding bills from these companies.  You will be contacted with the lab results as soon as they are available. The fastest way to get your results is to activate your My Chart account. Instructions are located on the last page of this paperwork. If you have not heard from [place] regarding the results in 2 weeks, please contact this office.

## 2015-06-01 LAB — COMPREHENSIVE METABOLIC PANEL
ALK PHOS: 70 U/L (ref 40–115)
ALT: 27 U/L (ref 9–46)
AST: 16 U/L (ref 10–35)
Albumin: 4.1 g/dL (ref 3.6–5.1)
BILIRUBIN TOTAL: 1.4 mg/dL — AB (ref 0.2–1.2)
BUN: 12 mg/dL (ref 7–25)
CHLORIDE: 95 mmol/L — AB (ref 98–110)
CO2: 29 mmol/L (ref 20–31)
CREATININE: 1.4 mg/dL — AB (ref 0.70–1.33)
Calcium: 9.9 mg/dL (ref 8.6–10.3)
GLUCOSE: 105 mg/dL — AB (ref 65–99)
POTASSIUM: 3.4 mmol/L — AB (ref 3.5–5.3)
SODIUM: 138 mmol/L (ref 135–146)
TOTAL PROTEIN: 8 g/dL (ref 6.1–8.1)

## 2015-06-01 LAB — LIPID PANEL
CHOL/HDL RATIO: 6.2 ratio — AB (ref <=5.0)
CHOLESTEROL: 283 mg/dL — AB (ref 125–200)
HDL: 46 mg/dL (ref >=40)
LDL CALC: 200 mg/dL — AB (ref <130)
TRIGLYCERIDES: 183 mg/dL — AB (ref <150)
VLDL: 37 mg/dL — AB (ref <30)

## 2015-06-03 ENCOUNTER — Other Ambulatory Visit: Payer: Self-pay | Admitting: Family Medicine

## 2015-06-03 DIAGNOSIS — E785 Hyperlipidemia, unspecified: Secondary | ICD-10-CM

## 2015-06-03 MED ORDER — ATORVASTATIN CALCIUM 20 MG PO TABS: 20.0000 mg | ORAL_TABLET | Freq: Every day | ORAL | Status: DC

## 2015-08-27 ENCOUNTER — Ambulatory Visit: Payer: BLUE CROSS/BLUE SHIELD

## 2015-11-23 ENCOUNTER — Other Ambulatory Visit: Payer: Self-pay | Admitting: Family Medicine

## 2015-11-23 DIAGNOSIS — E119 Type 2 diabetes mellitus without complications: Secondary | ICD-10-CM

## 2016-02-20 ENCOUNTER — Other Ambulatory Visit: Payer: Self-pay | Admitting: Physician Assistant

## 2016-02-20 DIAGNOSIS — E119 Type 2 diabetes mellitus without complications: Secondary | ICD-10-CM

## 2016-03-31 ENCOUNTER — Other Ambulatory Visit: Payer: Self-pay | Admitting: Physician Assistant

## 2016-03-31 DIAGNOSIS — E119 Type 2 diabetes mellitus without complications: Secondary | ICD-10-CM

## 2016-03-31 NOTE — Telephone Encounter (Signed)
Left message to schedule visit before this supply is exhausted.  Meds ordered this encounter  Medications  . metFORMIN (GLUCOPHAGE) 500 MG tablet    Sig: TAKE ONE TABLET BY MOUTH ONCE DAILY WITH  BREAKFAST.  OFFICE  VISIT  NEEDED  FOR  REFILLS    Dispense:  30 tablet    Refill:  0    Please notify patient that s/he needs an office visit +/- labsfor additional refills.

## 2016-05-03 ENCOUNTER — Other Ambulatory Visit: Payer: Self-pay | Admitting: Physician Assistant

## 2016-05-03 DIAGNOSIS — E119 Type 2 diabetes mellitus without complications: Secondary | ICD-10-CM

## 2016-05-03 NOTE — Telephone Encounter (Signed)
Please call this patient and let him know that I have authorized a 30-day supply of his meds again, and schedule a follow-up/establish with new PCP for additional fills.

## 2016-05-16 ENCOUNTER — Emergency Department (HOSPITAL_COMMUNITY)
Admission: EM | Admit: 2016-05-16 | Discharge: 2016-05-17 | Disposition: A | Discharge status: Home / Self Care | Payer: BLUE CROSS/BLUE SHIELD | Attending: Emergency Medicine | Admitting: Emergency Medicine

## 2016-05-16 ENCOUNTER — Emergency Department (HOSPITAL_COMMUNITY): Payer: BLUE CROSS/BLUE SHIELD

## 2016-05-16 ENCOUNTER — Encounter (HOSPITAL_COMMUNITY): Payer: Self-pay | Admitting: Emergency Medicine

## 2016-05-16 DIAGNOSIS — Z87891 Personal history of nicotine dependence: Secondary | ICD-10-CM | POA: Insufficient documentation

## 2016-05-16 DIAGNOSIS — N50812 Left testicular pain: Secondary | ICD-10-CM

## 2016-05-16 DIAGNOSIS — Z8673 Personal history of transient ischemic attack (TIA), and cerebral infarction without residual deficits: Secondary | ICD-10-CM | POA: Insufficient documentation

## 2016-05-16 DIAGNOSIS — N451 Epididymitis: Secondary | ICD-10-CM

## 2016-05-16 DIAGNOSIS — Z79899 Other long term (current) drug therapy: Secondary | ICD-10-CM | POA: Insufficient documentation

## 2016-05-16 DIAGNOSIS — I10 Essential (primary) hypertension: Secondary | ICD-10-CM | POA: Insufficient documentation

## 2016-05-16 DIAGNOSIS — Z7984 Long term (current) use of oral hypoglycemic drugs: Secondary | ICD-10-CM | POA: Insufficient documentation

## 2016-05-16 LAB — URINALYSIS, ROUTINE W REFLEX MICROSCOPIC
BILIRUBIN URINE: NEGATIVE
Glucose, UA: NEGATIVE mg/dL
Ketones, ur: NEGATIVE mg/dL
Nitrite: NEGATIVE
Protein, ur: 100 mg/dL — AB
SPECIFIC GRAVITY, URINE: 1.027 (ref 1.005–1.030)
Squamous Epithelial / LPF: NONE SEEN
pH: 6 (ref 5.0–8.0)

## 2016-05-16 LAB — COMPREHENSIVE METABOLIC PANEL
ALBUMIN: 3.7 g/dL (ref 3.5–5.0)
ALK PHOS: 95 U/L (ref 38–126)
ALT: 39 U/L (ref 17–63)
AST: 23 U/L (ref 15–41)
Anion gap: 9 (ref 5–15)
BUN: 9 mg/dL (ref 6–20)
CALCIUM: 9.4 mg/dL (ref 8.9–10.3)
CO2: 28 mmol/L (ref 22–32)
CREATININE: 1.4 mg/dL — AB (ref 0.61–1.24)
Chloride: 99 mmol/L — ABNORMAL LOW (ref 101–111)
GFR calc Af Amer: >60 mL/min (ref >60)
GFR, EST NON AFRICAN AMERICAN: 53 mL/min — AB (ref >60)
GLUCOSE: 113 mg/dL — AB (ref 65–99)
POTASSIUM: 3.5 mmol/L (ref 3.5–5.1)
Sodium: 136 mmol/L (ref 135–145)
TOTAL PROTEIN: 8.4 g/dL — AB (ref 6.5–8.1)
Total Bilirubin: 1.4 mg/dL — ABNORMAL HIGH (ref 0.3–1.2)

## 2016-05-16 LAB — CBC
HEMATOCRIT: 44.5 % (ref 39.0–52.0)
Hemoglobin: 15.5 g/dL (ref 13.0–17.0)
MCH: 29 pg (ref 26.0–34.0)
MCHC: 34.8 g/dL (ref 30.0–36.0)
MCV: 83.3 fL (ref 78.0–100.0)
PLATELETS: 288 10*3/uL (ref 150–400)
RBC: 5.34 MIL/uL (ref 4.22–5.81)
RDW: 13.5 % (ref 11.5–15.5)
WBC: 21 10*3/uL — AB (ref 4.0–10.5)

## 2016-05-16 LAB — I-STAT CG4 LACTIC ACID, ED
LACTIC ACID, VENOUS: 0.95 mmol/L (ref 0.5–1.9)
LACTIC ACID, VENOUS: 0.83 mmol/L (ref 0.5–1.9)

## 2016-05-16 NOTE — ED Notes (Signed)
Pt requesting to stay in his clothes. MD aware.

## 2016-05-16 NOTE — ED Notes (Signed)
[place]

## 2016-05-16 NOTE — ED Notes (Signed)
[place] to [place].

## 2016-05-16 NOTE — ED Notes (Signed)
[place] called, reporting pt is next.

## 2016-05-16 NOTE — ED Notes (Signed)
[place] called reporting pt is next.

## 2016-05-16 NOTE — ED Triage Notes (Signed)
Pt sts testicle swelling x 3 days with hx of same

## 2016-05-16 NOTE — ED Notes (Signed)
[place] in [place]

## 2016-05-16 NOTE — ED Notes (Signed)
Pt reports pain started around Saturday and he noticed swelling in his scrotum. Denies urinary symptoms.

## 2016-05-16 NOTE — ED Notes (Signed)
EDP made aware of pt O2 sat 88-94%.

## 2016-05-16 NOTE — ED Provider Notes (Signed)
[facility] DEPT Provider Note   CSN: [id number] Arrival date & time: 05/16/16  1836     History   Chief Complaint Chief Complaint  Patient presents with  . Testicle Pain    HPI John Schaefer is a 61 y.o. male.  The history is provided by the patient and medical records.  Testicle Pain    61 year old male with history of hypertension and prior stroke, presenting to the ED for left testicle pain. Reports he first noticed this on Saturday afternoon (4 days ago), but has been increasing since then.  States mostly he feels this in his left testicle, worse when sitting down, but does report a "heaviness" when he is standing. Reports he has felt intermittently nauseated but denies any vomiting or diarrhea. He has not had any difficulty urinating. He denies any abnormal discharge or bleeding. He denies any new sexual partners. No history of STD. Reports similar episode about 4 years ago, reports he took some antibiotics and symptoms improved.   Past Medical History:  Diagnosis Date  . Blood transfusion without reported diagnosis   . Hypertension   . Stroke [facility])     Patient Active Problem List   Diagnosis Date Noted  . Chronic gout 02/20/2014  . Essential hypertension 10/27/2012  . Gout 10/27/2012    History reviewed. No pertinent surgical history.     Home Medications    Prior to Admission medications   Medication Sig Start Date End Date Taking? Authorizing Provider  allopurinol (ZYLOPRIM) 300 MG tablet Take 1 tablet (300 mg total) by mouth daily. 05/31/15   Posey Boyer, MD  amLODipine (NORVASC) 10 MG tablet Take 1 tablet (10 mg total) by mouth daily. 05/31/15   Posey Boyer, MD  atorvastatin (LIPITOR) 20 MG tablet Take 1 tablet (20 mg total) by mouth daily. 06/03/15   Posey Boyer, MD  losartan-hydrochlorothiazide (HYZAAR) 100-25 MG tablet Take 1 tablet by mouth daily. 05/31/15   Posey Boyer, MD  metFORMIN (GLUCOPHAGE) 500 MG tablet TAKE 1 TABLET BY MOUTH ONCE  DAILY WITH BREAKFAST **OFFICE  VISIT  NEEDED FOR LABS  FOR  REFILLS** 05/03/16   Chelle Jeffery, PA-C  sildenafil (REVATIO) 20 MG tablet Take 1 tablet (20 mg total) by mouth 3 (three) times daily. 05/31/15   Posey Boyer, MD  vardenafil (LEVITRA) 20 MG tablet Use 1/2-1 pill as needed 05/31/15   Posey Boyer, MD    Family History History reviewed. No pertinent family history.  Social History Social History  Substance Use Topics  . Smoking status: Former Research scientist (life sciences)  . Smokeless tobacco: Never Used  . Alcohol use No     Allergies   Patient has no known allergies.   Review of Systems Review of Systems  Genitourinary: Positive for testicular pain.  All other systems reviewed and are negative.    Physical Exam Updated Vital Signs BP 138/82 (BP Location: Right Arm)   Pulse (!) 111   Temp (!) 100.8 F (38.2 C) (Oral)   Resp 17   SpO2 94%   Physical Exam  Constitutional: He is oriented to person, place, and time. He appears well-developed and well-nourished.  HENT:  Head: Normocephalic and atraumatic.  Mouth/Throat: Oropharynx is clear and moist.  Eyes: Conjunctivae and EOM are normal. Pupils are equal, round, and reactive to light.  Neck: Normal range of motion.  Cardiovascular: Normal rate, regular rhythm and normal heart sounds.   Pulmonary/Chest: Effort normal and breath sounds normal. No respiratory distress. He  has no wheezes.  Abdominal: Soft. Bowel sounds are normal. There is no tenderness. There is no rebound.  Genitourinary: Uncircumcised. No discharge found.  Genitourinary Comments: Left testicle is swollen when compared with right, testicle is very firm to the touch without appreciable mass, tenderness worse along posterior aspect; there is no overlying erythema or induration, no evidence of abscess formation, normal lie, no appreciable discharge  Musculoskeletal: Normal range of motion.  Neurological: He is alert and oriented to person, place, and time.  Skin: Skin  is warm and dry.  Psychiatric: He has a normal mood and affect.  Nursing note and vitals reviewed.    ED Treatments / Results  Labs (all labs ordered are listed, but only abnormal results are displayed) Labs Reviewed  COMPREHENSIVE METABOLIC PANEL - Abnormal; Notable for the following:       Result Value   Chloride 99 (*)    Glucose, Bld 113 (*)    Creatinine, Ser 1.40 (*)    Total Protein 8.4 (*)    Total Bilirubin 1.4 (*)    GFR calc non Af Amer 53 (*)    All other components within normal limits  CBC - Abnormal; Notable for the following:    WBC 21.0 (*)    All other components within normal limits  URINALYSIS, ROUTINE W REFLEX MICROSCOPIC - Abnormal; Notable for the following:    Color, Urine AMBER (*)    APPearance CLOUDY (*)    Hgb urine dipstick SMALL (*)    Protein, ur 100 (*)    Leukocytes, UA LARGE (*)    Bacteria, UA RARE (*)    All other components within normal limits  I-STAT CG4 LACTIC ACID, ED  I-STAT CG4 LACTIC ACID, ED    EKG  EKG Interpretation None       Radiology US Scrotum  Result Date: 05/16/2016 CLINICAL DATA:  Left-sided testicular pain and swelling EXAM: SCROTAL ULTRASOUND DOPPLER ULTRASOUND OF THE TESTICLES TECHNIQUE: Complete ultrasound examination of the testicles, epididymis, and other scrotal structures was performed. Color and spectral Doppler ultrasound were also utilized to evaluate blood flow to the testicles. COMPARISON:  12/03/2012 FINDINGS: Right testicle Measurements: 4.9 x 2.4 x 3.5 cm. No mass or microlithiasis visualized. Left testicle Measurements: 4 x 3.5 x 3.3 cm. A cyst of the tunica albuginea is noted incidentally measuring 0.9 x 0.7 x 1 cm along its lower pole. Right epididymis: Normal in size and appearance. Small 0.5 x 0.6 x 0.4 cm simple epididymal head cyst is seen. Left epididymis: Heterogeneous and mildly enlarged as well as hyperemic measuring 2.8 x 1.5 x 3.3 cm. Hydrocele:  Moderate left and small right hydroceles.  Varicocele:  None visualized. Pulsed Doppler interrogation of both testes demonstrates normal low resistance arterial and venous waveforms bilaterally. IMPRESSION: 1. No testicular torsion. 2. Tunica albuginea cyst of the left testicle of incidental note measuring 0.9 x 0.7 x 1 cm. 3. Simple right epididymal head cyst measuring 0.5 x 0.6 x 0.4 cm. 4. Heterogeneous mild enlargement of the left epididymis with hyperemia consistent with epididymitis. 5. Small hydroceles. Electronically Signed   By: Ashley Royalty M.D.   On: 05/16/2016 23:59   [place] Art/ven Flow Abd Pelv Doppler  Result Date: 05/16/2016 CLINICAL DATA:  Left-sided testicular pain and swelling EXAM: SCROTAL ULTRASOUND DOPPLER ULTRASOUND OF THE TESTICLES TECHNIQUE: Complete ultrasound examination of the testicles, epididymis, and other scrotal structures was performed. Color and spectral Doppler ultrasound were also utilized to evaluate blood flow to the testicles. COMPARISON:  12/03/2012 FINDINGS: Right testicle Measurements: 4.9 x 2.4 x 3.5 cm. No mass or microlithiasis visualized. Left testicle Measurements: 4 x 3.5 x 3.3 cm. A cyst of the tunica albuginea is noted incidentally measuring 0.9 x 0.7 x 1 cm along its lower pole. Right epididymis: Normal in size and appearance. Small 0.5 x 0.6 x 0.4 cm simple epididymal head cyst is seen. Left epididymis: Heterogeneous and mildly enlarged as well as hyperemic measuring 2.8 x 1.5 x 3.3 cm. Hydrocele:  Moderate left and small right hydroceles. Varicocele:  None visualized. Pulsed Doppler interrogation of both testes demonstrates normal low resistance arterial and venous waveforms bilaterally. IMPRESSION: 1. No testicular torsion. 2. Tunica albuginea cyst of the left testicle of incidental note measuring 0.9 x 0.7 x 1 cm. 3. Simple right epididymal head cyst measuring 0.5 x 0.6 x 0.4 cm. 4. Heterogeneous mild enlargement of the left epididymis with hyperemia consistent with epididymitis. 5. Small hydroceles.  Electronically Signed   By: Ashley Royalty M.D.   On: 05/16/2016 23:59    Procedures Procedures (including critical care time)  Medications Ordered in ED Medications - No data to display   Initial Impression / Assessment and Plan / ED Course  I have reviewed the triage vital signs and the nursing notes.  Pertinent labs & imaging results that were available during my care of the patient were reviewed by me and considered in my medical decision making (see chart for details).  61 year old male here with left testicular pain and swelling for the past 4 days. He has a low-grade fever here but is nontoxic in appearance. His left testicle is swollen and firm in appearance when compared with right. There is no overlying cellulitis or warmth to touch noted. He has a normal lie. No appreciable penile discharge or bleeding. Abdomen is soft and benign. Lab work obtained, does have a leukocytosis of 21K, however lactate is normal x2.  Urine with WBCs noted, however rare bacteria. He denies any current urinary symptoms.  Scrotal ultrasound obtained revealing left epididymitis, there also some benign-appearing cysts as well as small hydroceles. There is no evidence of torsion. Patient denies any new sexual partners or symptoms of STDs. He will be treated with 2 week course of ciprofloxacin-- first dose given here. This is his second occurrence of epididymitis in the past few years so I recommended that he follow-up with urology.  Discussed plan with patient, he acknowledged understanding and agreed with plan of care.  Return precautions given for new or worsening symptoms.  Of note, patient did have some brief hypoxia while he was sleeping. Once awoken, his O2 sats returned well into the 90s. He denies any shortness of breath, chest pain, cough, or other respiratory symptoms at this time.  Unknown if there is a possible component of sleep apnea.  Final Clinical Impressions(s) / ED Diagnoses   Final diagnoses:    Pain in left testicle    New Prescriptions New Prescriptions   CIPROFLOXACIN (CIPRO) 500 MG TABLET    Take 1 tablet (500 mg total) by mouth every 12 (twelve) hours.     Larene Pickett, PA-C 05/17/16 [place], DO 05/17/16 1544

## 2016-05-16 NOTE — ED Notes (Signed)
ED Provider at bedside. 

## 2016-05-17 MED ORDER — CIPROFLOXACIN HCL 500 MG PO TABS
500.0000 mg | ORAL_TABLET | Freq: Once | ORAL | Status: AC
  Administered 2016-05-17: 500 mg via ORAL
  Filled 2016-05-17: qty 1

## 2016-05-17 MED ORDER — CIPROFLOXACIN HCL 500 MG PO TABS: 500.0000 mg | ORAL_TABLET | Freq: Two times a day (BID) | ORAL | 0 refills | Status: DC

## 2016-05-17 NOTE — Discharge Instructions (Signed)
Take the prescribed medication as directed.  You can take tylenol or motrin for pain. Follow-up with [facility]-- I recommend you call in the morning for appt. Return to the ED for new or worsening symptoms-- increased swelling, high fever, trouble urinating, etc.

## 2016-05-30 ENCOUNTER — Other Ambulatory Visit: Payer: Self-pay | Admitting: Family Medicine

## 2016-05-30 ENCOUNTER — Other Ambulatory Visit: Payer: Self-pay | Admitting: Physician Assistant

## 2016-05-30 DIAGNOSIS — I10 Essential (primary) hypertension: Secondary | ICD-10-CM

## 2016-05-30 DIAGNOSIS — E119 Type 2 diabetes mellitus without complications: Secondary | ICD-10-CM

## 2016-05-30 DIAGNOSIS — E785 Hyperlipidemia, unspecified: Secondary | ICD-10-CM

## 2016-05-30 DIAGNOSIS — E790 Hyperuricemia without signs of inflammatory arthritis and tophaceous disease: Secondary | ICD-10-CM

## 2016-05-30 NOTE — Telephone Encounter (Signed)
Call --- I approved a refill of his medications for one month only as patient is overdue for follow-up; last office visit in 05/2015 with Dr. Linna Darner who has retired.  Please schedule pt an appointment with Dr. Brigitte Pulse or another NEW provider to establish care THIS MONTH. He has seen Dr. Brigitte Pulse in the past.

## 2016-05-31 NOTE — Telephone Encounter (Signed)
Please call patient. Rx authorized. Needs to schedule follow-up and establish with new PCP since Dr. Linna Darner has retired.  Meds ordered this encounter  Medications  . metFORMIN (GLUCOPHAGE) 500 MG tablet    Sig: TAKE 1 TABLET BY MOUTH ONCE DAILY WITH  BREAKFAST. OFFICE VISIT NEEDED FOR LABS FOR REFILLS.    Dispense:  30 tablet    Refill:  0    Please notify patient that s/he needs an office visit +/- labsfor additional refills.

## 2016-06-01 ENCOUNTER — Telehealth: Payer: Self-pay | Admitting: Family Medicine

## 2016-06-01 NOTE — Telephone Encounter (Signed)
Couldn't lmom please schedule an appointment with a new PCP since John Schaefer has retired for [employer] and labs with additional refills on medicines please look at pt BD and [facility] tried to speak with pt but the back ground was to loud and pt states that he was going to call [name] back

## 2016-07-03 ENCOUNTER — Ambulatory Visit: Payer: BLUE CROSS/BLUE SHIELD | Admitting: Physician Assistant

## 2016-07-08 ENCOUNTER — Other Ambulatory Visit: Payer: Self-pay | Admitting: Physician Assistant

## 2016-07-08 ENCOUNTER — Other Ambulatory Visit: Payer: Self-pay | Admitting: Family Medicine

## 2016-07-08 DIAGNOSIS — E790 Hyperuricemia without signs of inflammatory arthritis and tophaceous disease: Secondary | ICD-10-CM

## 2016-07-08 DIAGNOSIS — E785 Hyperlipidemia, unspecified: Secondary | ICD-10-CM

## 2016-07-08 DIAGNOSIS — E119 Type 2 diabetes mellitus without complications: Secondary | ICD-10-CM

## 2016-07-08 DIAGNOSIS — I10 Essential (primary) hypertension: Secondary | ICD-10-CM

## 2016-07-08 NOTE — Telephone Encounter (Signed)
Please call patient. Rx authorized. Needs to schedule follow-up and establish with new PCP since Dr. Linna Darner has retired. The patient had an appointment with Rhetta Mura, PA-C on 5/18, but canceled.  Meds ordered this encounter  Medications  . metFORMIN (GLUCOPHAGE) 500 MG tablet    Sig: TAKE 1 TABLET BY MOUTH ONCE DAILY WITH  BREAKFAST.  (OFFICE  VISIT  NEEDED  FOR  LABS  FOR  REFILLS)    Dispense:  30 tablet    Refill:  0    Please notify patient that s/he needs an office visit +/- labsfor additional refills.

## 2016-07-08 NOTE — Telephone Encounter (Signed)
Meds ordered this encounter  Medications  . allopurinol (ZYLOPRIM) 300 MG tablet    Sig: TAKE 1 TABLET BY MOUTH ONCE DAILY    Dispense:  30 tablet    Refill:  0    Please notify patient that s/he needs an office visit +/- labsfor additional refills.  Marland Kitchen losartan-hydrochlorothiazide (HYZAAR) 100-25 MG tablet    Sig: TAKE 1 TABLET BY MOUTH ONCE DAILY    Dispense:  30 tablet    Refill:  0    Please notify patient that s/he needs an office visit +/- labsfor additional refills.  Marland Kitchen amLODipine (NORVASC) 10 MG tablet    Sig: TAKE 1 TABLET BY MOUTH ONCE DAILY    Dispense:  30 tablet    Refill:  0    Please notify patient that s/he needs an office visit +/- labsfor additional refills.  Marland Kitchen atorvastatin (LIPITOR) 20 MG tablet    Sig: TAKE 1 TABLET BY MOUTH ONCE DAILY    Dispense:  30 tablet    Refill:  0    Please notify patient that s/he needs an office visit +/- labsfor additional refills.

## 2016-07-10 ENCOUNTER — Telehealth: Payer: Self-pay | Admitting: Family Medicine

## 2016-07-10 NOTE — Telephone Encounter (Signed)
LMOM FOR PT TO SET UP AN OV FOR ESTABLISH CARE SINCE [facility] RETIRED PT HAS AN APPOINTMENT WITH [facility] BUT HAD CANCELLED PLEASE SET THIS PT UP WITH AN OV FOR DM AND MED REFILL

## 2016-08-09 ENCOUNTER — Other Ambulatory Visit: Payer: Self-pay | Admitting: Physician Assistant

## 2016-08-09 DIAGNOSIS — E119 Type 2 diabetes mellitus without complications: Secondary | ICD-10-CM

## 2016-08-09 DIAGNOSIS — E790 Hyperuricemia without signs of inflammatory arthritis and tophaceous disease: Secondary | ICD-10-CM

## 2016-08-09 DIAGNOSIS — E785 Hyperlipidemia, unspecified: Secondary | ICD-10-CM

## 2016-08-09 DIAGNOSIS — I10 Essential (primary) hypertension: Secondary | ICD-10-CM

## 2016-08-11 ENCOUNTER — Telehealth: Payer: Self-pay | Admitting: *Deleted

## 2016-08-11 ENCOUNTER — Telehealth: Payer: Self-pay | Admitting: Physician Assistant

## 2016-08-11 NOTE — Telephone Encounter (Signed)
Pt called on 08/09/16 with several medications for refill but has been denied due to having to come in to make and appt but pt insurance has lapsed and he is waiting for it to be reinstated before he makes this appt but needs his meds and wants to know another month supply could be called in   Best number [phone]

## 2016-08-12 ENCOUNTER — Encounter: Payer: Self-pay | Admitting: Physician Assistant

## 2016-08-12 ENCOUNTER — Ambulatory Visit (INDEPENDENT_AMBULATORY_CARE_PROVIDER_SITE_OTHER): Payer: Self-pay | Admitting: Physician Assistant

## 2016-08-12 VITALS — BP 122/76 | HR 66 | Temp 98.2°F | Resp 17 | Ht 69.25 in | Wt 253.2 lb

## 2016-08-12 DIAGNOSIS — R972 Elevated prostate specific antigen [PSA]: Secondary | ICD-10-CM

## 2016-08-12 DIAGNOSIS — E119 Type 2 diabetes mellitus without complications: Secondary | ICD-10-CM

## 2016-08-12 DIAGNOSIS — Z125 Encounter for screening for malignant neoplasm of prostate: Secondary | ICD-10-CM

## 2016-08-12 DIAGNOSIS — Z1159 Encounter for screening for other viral diseases: Secondary | ICD-10-CM

## 2016-08-12 DIAGNOSIS — Z23 Encounter for immunization: Secondary | ICD-10-CM

## 2016-08-12 DIAGNOSIS — N529 Male erectile dysfunction, unspecified: Secondary | ICD-10-CM

## 2016-08-12 DIAGNOSIS — Z6837 Body mass index (BMI) 37.0-37.9, adult: Secondary | ICD-10-CM

## 2016-08-12 DIAGNOSIS — Z114 Encounter for screening for human immunodeficiency virus [HIV]: Secondary | ICD-10-CM

## 2016-08-12 DIAGNOSIS — Z1211 Encounter for screening for malignant neoplasm of colon: Secondary | ICD-10-CM

## 2016-08-12 DIAGNOSIS — E785 Hyperlipidemia, unspecified: Secondary | ICD-10-CM

## 2016-08-12 DIAGNOSIS — I10 Essential (primary) hypertension: Secondary | ICD-10-CM

## 2016-08-12 MED ORDER — SILDENAFIL CITRATE 20 MG PO TABS: 20.0000 mg | ORAL_TABLET | Freq: Three times a day (TID) | ORAL | 5 refills | Status: DC

## 2016-08-12 MED ORDER — METFORMIN HCL 500 MG PO TABS: 500.0000 mg | ORAL_TABLET | Freq: Two times a day (BID) | ORAL | 3 refills | Status: DC

## 2016-08-12 MED ORDER — LOSARTAN POTASSIUM-HCTZ 100-25 MG PO TABS: 1.0000 | ORAL_TABLET | Freq: Every day | ORAL | 3 refills | Status: DC

## 2016-08-12 MED ORDER — AMLODIPINE BESYLATE 10 MG PO TABS: 10.0000 mg | ORAL_TABLET | Freq: Every day | ORAL | 3 refills | Status: DC

## 2016-08-12 MED ORDER — ATORVASTATIN CALCIUM 20 MG PO TABS: 20.0000 mg | ORAL_TABLET | Freq: Every day | ORAL | 3 refills | Status: DC

## 2016-08-12 NOTE — Progress Notes (Signed)
Patient ID: John Schaefer, male    DOB: 1955-02-24, 61 y.o.   MRN: [id number]  PCP: Harrison Mons, PA-C  Chief Complaint  Patient presents with  . Medication Refill    amlodipine, lipitor, metformin, revatio    Subjective:   Presents for medication refills. She needs re-evaluation of diabetes, HTN and lipids. In addition, he needs to establish with new PCP, as his previous provider has left this practice.  Has made significant lifestyle changes and wonders if he still needs the statin and metformin. He is NOT fasting today, unfortunately.  No CP, SOB, HA< dizziness. No nausea, vomiting or diarrhea. No unexplained fatigue, joint or muscle pain.  No increased urinary urgency, frequency or burning. No increased thirst or hunger. He has intentionally lost weight though elimination of sugar from his diet and increasing his exercise.    Review of Systems As above.    Patient Active Problem List   Diagnosis Date Noted  . Chronic gout 02/20/2014  . Essential hypertension 10/27/2012  . Gout 10/27/2012     Prior to Admission medications   Medication Sig Start Date End Date Taking? Authorizing Provider  allopurinol (ZYLOPRIM) 300 MG tablet TAKE 1 TABLET BY MOUTH ONCE DAILY 07/08/16  Yes Dare Spillman, PA-C  amLODipine (NORVASC) 10 MG tablet TAKE 1 TABLET BY MOUTH ONCE DAILY 07/08/16  Yes Mahdiya Mossberg, PA-C  atorvastatin (LIPITOR) 20 MG tablet TAKE 1 TABLET BY MOUTH ONCE DAILY 07/08/16  Yes Onisha Cedeno, PA-C  losartan-hydrochlorothiazide (HYZAAR) 100-25 MG tablet TAKE 1 TABLET BY MOUTH ONCE DAILY 07/08/16  Yes Quintasha Gren, PA-C  metFORMIN (GLUCOPHAGE) 500 MG tablet TAKE 1 TABLET BY MOUTH ONCE DAILY WITH  BREAKFAST.  (OFFICE  VISIT  NEEDED  FOR  LABS  FOR  REFILLS) 07/08/16  Yes Shakeria Robinette, PA-C  sildenafil (REVATIO) 20 MG tablet Take 1 tablet (20 mg total) by mouth 3 (three) times daily. 05/31/15  Yes Posey Boyer, MD  vardenafil (LEVITRA) 20 MG tablet Use 1/2-1  pill as needed 05/31/15  Yes Posey Boyer, MD     No Known Allergies     Objective:  Physical Exam  Constitutional: He is oriented to person, place, and time. He appears well-developed and well-nourished. He is active and cooperative. No distress.  BP 122/76   Pulse 66   Temp 98.2 F (36.8 C) (Oral)   Resp 17   Ht 5' 9.25" (1.759 m)   Wt 253 lb 4 oz (114.9 kg)   SpO2 96%   BMI 37.13 kg/m   HENT:  Head: Normocephalic and atraumatic.  Right Ear: Hearing normal.  Left Ear: Hearing normal.  Eyes: Conjunctivae are normal. No scleral icterus.  Neck: Normal range of motion. Neck supple. No thyromegaly present.  Cardiovascular: Normal rate, regular rhythm and normal heart sounds.   Pulses:      Radial pulses are 2+ on the right side, and 2+ on the left side.  Pulmonary/Chest: Effort normal and breath sounds normal.  Lymphadenopathy:       Head (right side): No tonsillar, no preauricular, no posterior auricular and no occipital adenopathy present.       Head (left side): No tonsillar, no preauricular, no posterior auricular and no occipital adenopathy present.    He has no cervical adenopathy.       Right: No supraclavicular adenopathy present.       Left: No supraclavicular adenopathy present.  Neurological: He is alert and oriented to person, place, and time.  No sensory deficit.  Skin: Skin is warm, dry and intact. Lesion (multiple keloids on the upper chest) noted. No rash noted. No cyanosis or erythema. Nails show no clubbing.  Psychiatric: He has a normal mood and affect. His speech is normal and behavior is normal.           Assessment & Plan:   Problem List Items Addressed This Visit    Essential hypertension    Well controlled. Continue ARB-HCTZ and CCB.      Relevant Medications   amLODipine (NORVASC) 10 MG tablet   losartan-hydrochlorothiazide (HYZAAR) 100-25 MG tablet   atorvastatin (LIPITOR) 20 MG tablet   sildenafil (REVATIO) 20 MG tablet    Hyperlipidemia    Await labs. Adjust regimen as indicated by results. Recommendations are for LDL <70. Likely needs to continue statin, but would consider lowering dose. Encouraged continued efforts for healthy lifestyle changes and weight loss.      Relevant Medications   amLODipine (NORVASC) 10 MG tablet   losartan-hydrochlorothiazide (HYZAAR) 100-25 MG tablet   atorvastatin (LIPITOR) 20 MG tablet   sildenafil (REVATIO) 20 MG tablet   Other Relevant Orders   Lipid panel (Completed)   BMI 37.0-37.9, adult   Erectile dysfunction - Primary    Prefers generic sildenafil, as it allows him to use to lowest possible dose and reduces his OOP cost.      Relevant Medications   sildenafil (REVATIO) 20 MG tablet   Type 2 diabetes mellitus without complication, without long-term current use of insulin ([place])    Await labs. Adjust regimen as indicated by results. Will consider elimination of metformin if A1C <6%      Relevant Medications   losartan-hydrochlorothiazide (HYZAAR) 100-25 MG tablet   atorvastatin (LIPITOR) 20 MG tablet   metFORMIN (GLUCOPHAGE) 500 MG tablet   Other Relevant Orders   Hemoglobin A1c (Completed)   HM Diabetes Eye Exam (Completed)   HM Diabetes Foot Exam (Completed)    Other Visit Diagnoses    Screening for HIV (human immunodeficiency virus)       Relevant Orders   HIV antibody (Completed)   Need for hepatitis C screening test       Relevant Orders   Hepatitis C antibody (Completed)   Screening for colon cancer       Relevant Orders   Ambulatory referral to Gastroenterology   Screening for prostate cancer       Relevant Orders   PSA (Completed)   Need for Tdap vaccination       Relevant Orders   Tdap vaccine greater than or equal to 7yo IM (Completed)   Care order/instruction:   Need for pneumococcal vaccination       Relevant Orders   Pneumococcal polysaccharide vaccine 23-valent greater than or equal to 2yo subcutaneous/IM (Completed)   Care  order/instruction:       Return in about 3 months (around 11/12/2016) for Re-evaluation of diabetes, HTN, and cholesterol.   Fara Chute, PA-C [facility] [place]

## 2016-08-12 NOTE — Patient Instructions (Signed)
     IF you received an x-ray today, you will receive an invoice from [facility]. Please contact [facility] at [phone] with questions or concerns regarding your invoice.   IF you received labwork today, you will receive an invoice from [facility]. Please contact [facility] at [phone] with questions or concerns regarding your invoice.   Our billing staff will not be able to assist you with questions regarding bills from these companies.  You will be contacted with the lab results as soon as they are available. The fastest way to get your results is to activate your My Chart account. Instructions are located on the last page of this paperwork. If you have not heard from us regarding the results in 2 weeks, please contact this office.     

## 2016-08-12 NOTE — Progress Notes (Signed)
Chief Complaint  Patient presents with  . Medication Refill    amlodipine, lipitor, metformin, revatio   HPI John Schaefer is a 61 year old male who comes to the office today for medication refills. The office required the patient to come in for a visit in order to refill his medications, for he needs to establish a new PCP within our practice, as his retired.  Patient is not in a fasting state but would like blood work done to inquire if atorvastatin and metformin are still needed, for he has made lifestyle changes since his last visit. He has added fresh fruit and vegetables into his diet and cut out sugar. He walks during his job at least 30 minutes a day total, and says he would like to start walking around a track twice a week.  He last lipid panel was in 05/2015. He last CBC and CMP was in 05/2016.  He is aware of overdue health maintenance and would like to address what can be done at this visit.   Patient Active Problem List   Diagnosis Date Noted  . Chronic gout 02/20/2014  . Essential hypertension 10/27/2012  . Gout 10/27/2012   Prior to Admission medications   Medication Sig Start Date End Date Taking? Authorizing Provider  allopurinol (ZYLOPRIM) 300 MG tablet TAKE 1 TABLET BY MOUTH ONCE DAILY 07/08/16  Yes Jeffery, Chelle, PA-C  amLODipine (NORVASC) 10 MG tablet TAKE 1 TABLET BY MOUTH ONCE DAILY 07/08/16  Yes Jeffery, Chelle, PA-C  atorvastatin (LIPITOR) 20 MG tablet TAKE 1 TABLET BY MOUTH ONCE DAILY 07/08/16  Yes Jeffery, Chelle, PA-C  losartan-hydrochlorothiazide (HYZAAR) 100-25 MG tablet TAKE 1 TABLET BY MOUTH ONCE DAILY 07/08/16  Yes Jeffery, Chelle, PA-C  metFORMIN (GLUCOPHAGE) 500 MG tablet TAKE 1 TABLET BY MOUTH ONCE DAILY WITH  BREAKFAST.  (OFFICE  VISIT  NEEDED  FOR  LABS  FOR  REFILLS) 07/08/16  Yes Jeffery, Chelle, PA-C  sildenafil (REVATIO) 20 MG tablet Take 1 tablet (20 mg total) by mouth 3 (three) times daily. 08/12/16  Yes Jeffery, Chelle, PA-C  amLODipine (NORVASC) 10 MG  tablet Take 1 tablet (10 mg total) by mouth daily. 08/12/16   Harrison Mons, PA-C  atorvastatin (LIPITOR) 20 MG tablet Take 1 tablet (20 mg total) by mouth daily. 08/12/16   Harrison Mons, PA-C  losartan-hydrochlorothiazide (HYZAAR) 100-25 MG tablet Take 1 tablet by mouth daily. 08/12/16   Harrison Mons, PA-C  metFORMIN (GLUCOPHAGE) 500 MG tablet Take 1 tablet (500 mg total) by mouth 2 (two) times daily with a meal. 08/12/16   Jeffery, Chelle, PA-C   No Known Allergies  Review of Systems  Constitutional: Positive for weight loss (due to change in diet ). Negative for fever.  HENT: Negative for ear pain and hearing loss.   Eyes: Negative for blurred vision and double vision.  Respiratory: Negative for cough, shortness of breath and wheezing.   Cardiovascular: Negative for chest pain and leg swelling.  Gastrointestinal: Negative for blood in stool, constipation, diarrhea, heartburn, melena, nausea and vomiting.  Neurological: Negative for headaches.  Psychiatric/Behavioral: Negative for depression and suicidal ideas.    Vital Signs BP 122/76   Pulse 66   Temp 98.2 F (36.8 C) (Oral)   Resp 17   Ht 5' 9.25" (1.759 m)   Wt 253 lb 4 oz (114.9 kg)   SpO2 96%   BMI 37.13 kg/m   Physical Exam  Constitutional: He appears well-developed and well-nourished.  Cardiovascular: Normal rate and regular rhythm.  Pulmonary/Chest: Effort normal and breath sounds normal.  Lymphadenopathy:    He has no cervical adenopathy.  Skin:  Keloids noted on chest, abdomin, and neck.   Results for orders placed or performed in visit on 08/12/16  Hemoglobin A1c  Result Value Ref Range   Hgb A1c MFr Bld 6.3 (H) 4.8 - 5.6 %   Est. average glucose Bld gHb Est-mCnc 134 mg/dL  Lipid panel  Result Value Ref Range   Cholesterol, Total 157 100 - 199 mg/dL   Triglycerides 142 0 - 149 mg/dL   HDL 48 >39 mg/dL   VLDL Cholesterol Cal 28 5 - 40 mg/dL   LDL Calculated 81 0 - 99 mg/dL   Chol/HDL Ratio 3.3 0.0  - 5.0 ratio  HIV antibody  Result Value Ref Range   HIV Screen 4th Generation wRfx WILL FOLLOW   Hepatitis C antibody  Result Value Ref Range   Hep C Virus Ab <0.1 0.0 - 0.9 s/co ratio  PSA  Result Value Ref Range   Prostate Specific Ag, Serum 31.0 (H) 0.0 - 4.0 ng/mL    Assessment and Plan 1. Essential hypertension Refilled medication Please return to the office in 3 months to re-evaluate blood pressure - amLODipine (NORVASC) 10 MG tablet; Take 1 tablet (10 mg total) by mouth daily.  Dispense: 90 tablet; Refill: 3 - losartan-hydrochlorothiazide (HYZAAR) 100-25 MG tablet; Take 1 tablet by mouth daily.  Dispense: 90 tablet; Refill: 3  2. Type 2 diabetes mellitus without complication, without long-term current use of insulin ([place]) Please make an appointment for your annual eye exam. Please return to the office in 3 months to re-evaluate diabetes. - metFORMIN (GLUCOPHAGE) 500 MG tablet; Take 1 tablet (500 mg total) by mouth 2 (two) times daily with a meal.  Dispense: 180 tablet; Refill: 3 - Hemoglobin A1c - HM Diabetes Eye Exam - HM Diabetes Foot Exam  3. Erectile dysfunction, unspecified erectile dysfunction type - sildenafil (REVATIO) 20 MG tablet; Take 1 tablet (20 mg total) by mouth 3 (three) times daily.  Dispense: 15 tablet; Refill: 5  4. Hyperlipidemia, unspecified hyperlipidemia type Patient will be notified of results. Please return to the office in 3 months to re-evaluate cholesterol. - atorvastatin (LIPITOR) 20 MG tablet; Take 1 tablet (20 mg total) by mouth daily.  Dispense: 90 tablet; Refill: 3 - Lipid panel  5. BMI 37.0-37.9, adult Continue healthy eating and walking for at least 30 minutes a day.  6. Screening for HIV (human immunodeficiency virus) - HIV antibody  7. Need for hepatitis C screening test - Hepatitis C antibody  8. Screening for colon cancer The gastroenterology office will contact you to step up an appointment. - Ambulatory referral to  Gastroenterology  9. Screening for prostate cancer - PSA  10. Need for Tdap vaccination - Tdap vaccine greater than or equal to 7yo IM - Care order/instruction:  11. Need for pneumococcal vaccination - Pneumococcal polysaccharide vaccine 23-valent greater than or equal to 2yo subcutaneous/IM - Care order/instruction:

## 2016-08-12 NOTE — Telephone Encounter (Signed)
Patient was seen today and medications refilled.

## 2016-08-14 ENCOUNTER — Encounter: Payer: Self-pay | Admitting: Gastroenterology

## 2016-08-14 LAB — LIPID PANEL
CHOLESTEROL TOTAL: 157 mg/dL (ref 100–199)
Chol/HDL Ratio: 3.3 ratio (ref 0.0–5.0)
HDL: 48 mg/dL (ref >39)
LDL CALC: 81 mg/dL (ref 0–99)
Triglycerides: 142 mg/dL (ref 0–149)
VLDL CHOLESTEROL CAL: 28 mg/dL (ref 5–40)

## 2016-08-14 LAB — HEMOGLOBIN A1C
Est. average glucose Bld gHb Est-mCnc: 134 mg/dL
Hgb A1c MFr Bld: 6.3 % — ABNORMAL HIGH (ref 4.8–5.6)

## 2016-08-14 LAB — HIV ANTIBODY (ROUTINE TESTING W REFLEX): HIV SCREEN 4TH GENERATION: NONREACTIVE

## 2016-08-14 LAB — HEPATITIS C ANTIBODY

## 2016-08-14 LAB — PSA: PROSTATE SPECIFIC AG, SERUM: 31 ng/mL — AB (ref 0.0–4.0)

## 2016-08-16 ENCOUNTER — Encounter: Payer: Self-pay | Admitting: Physician Assistant

## 2016-08-16 DIAGNOSIS — R972 Elevated prostate specific antigen [PSA]: Secondary | ICD-10-CM | POA: Insufficient documentation

## 2016-08-16 NOTE — Progress Notes (Signed)
Patient informed. 

## 2016-08-16 NOTE — Progress Notes (Signed)
Lm to call back for lab results 

## 2016-08-17 NOTE — Assessment & Plan Note (Signed)
Await labs. Adjust regimen as indicated by results. Will consider elimination of metformin if A1C <6%

## 2016-08-17 NOTE — Assessment & Plan Note (Signed)
Prefers generic sildenafil, as it allows him to use to lowest possible dose and reduces his OOP cost.

## 2016-08-17 NOTE — Assessment & Plan Note (Signed)
Await labs. Adjust regimen as indicated by results. Recommendations are for LDL <70. Likely needs to continue statin, but would consider lowering dose. Encouraged continued efforts for healthy lifestyle changes and weight loss.

## 2016-08-17 NOTE — Assessment & Plan Note (Signed)
Well controlled. Continue ARB-HCTZ and CCB.

## 2016-08-21 ENCOUNTER — Telehealth: Payer: Self-pay | Admitting: Emergency Medicine

## 2016-08-21 NOTE — Telephone Encounter (Signed)
Patient was retuning your phone call about labs.  Contact number [phone]

## 2016-08-21 NOTE — Telephone Encounter (Signed)
-----   Message from [place], [place] sent at 08/19/2016 10:01 PM EDT ----- Please call this patient. See previous note.

## 2016-08-22 NOTE — Telephone Encounter (Signed)
Lm to call back

## 2016-08-24 NOTE — Telephone Encounter (Signed)
LM to call back. Unable to reach letter sent.

## 2016-08-25 ENCOUNTER — Telehealth: Payer: Self-pay | Admitting: Physician Assistant

## 2016-08-25 NOTE — Telephone Encounter (Signed)
Patient needs his allopurinol 3200 MG refilled, his mail order pharmacy called it in for him. He is now using [facility] not the [facility] that is on his account.

## 2016-08-29 NOTE — Telephone Encounter (Signed)
[facility] CALLING ABOUT PT REFILL ON ALLOPURINOL

## 2016-09-01 NOTE — Telephone Encounter (Signed)
LVM that pt needs OV before we are able to refill any meds.

## 2016-09-06 ENCOUNTER — Telehealth: Payer: Self-pay

## 2016-09-06 NOTE — Telephone Encounter (Signed)
Patient no showed for scheduled Pre-visit prior to colonoscopy that is scheduled on 09/19/16. No answer at home or cell phone. A message was left for patient to call and reschedule his appointment. Patient was notified that his colonoscopy will be cancelled if he does not reschedule by 5:00 Pm. If he does not call a no show letter will be mailed to his home.   Riki Sheer, LPN

## 2016-09-19 ENCOUNTER — Encounter: Payer: Self-pay | Admitting: Gastroenterology

## 2016-11-14 ENCOUNTER — Ambulatory Visit: Payer: PRIVATE HEALTH INSURANCE | Admitting: Physician Assistant

## 2016-11-17 ENCOUNTER — Other Ambulatory Visit: Payer: Self-pay | Admitting: Physician Assistant

## 2016-11-17 DIAGNOSIS — I10 Essential (primary) hypertension: Secondary | ICD-10-CM

## 2017-04-23 ENCOUNTER — Encounter: Payer: Self-pay | Admitting: Physician Assistant

## 2017-06-11 ENCOUNTER — Encounter: Payer: Self-pay | Admitting: Family Medicine

## 2017-06-18 ENCOUNTER — Other Ambulatory Visit: Payer: Self-pay | Admitting: Urology

## 2017-06-18 DIAGNOSIS — R972 Elevated prostate specific antigen [PSA]: Secondary | ICD-10-CM

## 2017-06-22 ENCOUNTER — Emergency Department (HOSPITAL_COMMUNITY)
Admission: EM | Admit: 2017-06-22 | Discharge: 2017-06-23 | Disposition: A | Discharge status: Home / Self Care | Payer: Self-pay | Attending: Emergency Medicine | Admitting: Emergency Medicine

## 2017-06-22 ENCOUNTER — Encounter (HOSPITAL_COMMUNITY): Payer: Self-pay | Admitting: Emergency Medicine

## 2017-06-22 DIAGNOSIS — R339 Retention of urine, unspecified: Secondary | ICD-10-CM | POA: Insufficient documentation

## 2017-06-22 DIAGNOSIS — R1030 Lower abdominal pain, unspecified: Secondary | ICD-10-CM | POA: Insufficient documentation

## 2017-06-22 DIAGNOSIS — Z87891 Personal history of nicotine dependence: Secondary | ICD-10-CM | POA: Insufficient documentation

## 2017-06-22 DIAGNOSIS — Z7984 Long term (current) use of oral hypoglycemic drugs: Secondary | ICD-10-CM | POA: Insufficient documentation

## 2017-06-22 DIAGNOSIS — I10 Essential (primary) hypertension: Secondary | ICD-10-CM | POA: Insufficient documentation

## 2017-06-22 DIAGNOSIS — E119 Type 2 diabetes mellitus without complications: Secondary | ICD-10-CM | POA: Insufficient documentation

## 2017-06-22 DIAGNOSIS — Z79899 Other long term (current) drug therapy: Secondary | ICD-10-CM | POA: Insufficient documentation

## 2017-06-22 NOTE — ED Provider Notes (Signed)
[facility] EMERGENCY DEPARTMENT Provider Note   CSN: [id number] Arrival date & time: 06/22/17  2211     History   Chief Complaint Chief Complaint  Patient presents with  . Urinary Retention    HPI John Schaefer is a 62 y.o. male.  Patient with history of diabetes and hypertension presenting with urinary retention all day.  States his last episode of urination was 7 AM.  He has a constant urge and suprapubic pressure and feeling that he has to go all day long.  He is only been able to urinate a few dribbles.  He feels much improved after catheter was placed in triage.  States he is never needed a catheter in the past.  He does see a urologist for his prostate.  States he had a biopsy of his prostate which was reassuring but is scheduled for an MRI of his prostate later this month to evaluate a "spot on it."  He denies any fevers, chills, nausea or vomiting.  No constipation or diarrhea.  He does not take any blood thinners.  The history is provided by the patient.    Past Medical History:  Diagnosis Date  . Hypertension   . Stroke [facility])     Patient Active Problem List   Diagnosis Date Noted  . Elevated PSA, greater than or equal to 20 ng/ml 08/16/2016  . Hyperlipidemia 08/12/2016  . BMI 37.0-37.9, adult 08/12/2016  . Erectile dysfunction 08/12/2016  . Type 2 diabetes mellitus without complication, without long-term current use of insulin ([place]) 08/12/2016  . Chronic gout 02/20/2014  . Essential hypertension 10/27/2012  . Gout 10/27/2012    History reviewed. No pertinent surgical history.      Home Medications    Prior to Admission medications   Medication Sig Start Date End Date Taking? Authorizing Provider  allopurinol (ZYLOPRIM) 300 MG tablet TAKE 1 TABLET BY MOUTH ONCE DAILY 07/08/16   Harrison Mons, PA-C  amLODipine (NORVASC) 10 MG tablet TAKE 1 TABLET BY MOUTH ONCE DAILY 07/08/16   Jacqulynn Cadet, Chelle, PA-C  amLODipine (NORVASC) 10 MG tablet Take 1  tablet (10 mg total) by mouth daily. 08/12/16   Harrison Mons, PA-C  atorvastatin (LIPITOR) 20 MG tablet TAKE 1 TABLET BY MOUTH ONCE DAILY 07/08/16   Harrison Mons, PA-C  atorvastatin (LIPITOR) 20 MG tablet Take 1 tablet (20 mg total) by mouth daily. 08/12/16   Harrison Mons, PA-C  losartan-hydrochlorothiazide (HYZAAR) 100-25 MG tablet TAKE 1 TABLET BY MOUTH ONCE DAILY 07/08/16   Harrison Mons, PA-C  losartan-hydrochlorothiazide (HYZAAR) 100-25 MG tablet Take 1 tablet by mouth daily. 08/12/16   Harrison Mons, PA-C  metFORMIN (GLUCOPHAGE) 500 MG tablet TAKE 1 TABLET BY MOUTH ONCE DAILY WITH  BREAKFAST.  (OFFICE  VISIT  NEEDED  FOR  LABS  FOR  REFILLS) 07/08/16   Harrison Mons, PA-C  metFORMIN (GLUCOPHAGE) 500 MG tablet Take 1 tablet (500 mg total) by mouth 2 (two) times daily with a meal. 08/12/16   Jeffery, Chelle, PA-C  sildenafil (REVATIO) 20 MG tablet Take 1 tablet (20 mg total) by mouth 3 (three) times daily. 08/12/16   Harrison Mons, PA-C    Family History Family History  Problem Relation Age of Onset  . Heart disease Father 11       MI  . Diabetes Brother   . Stroke Brother   . Heart disease Paternal Aunt     Social History Social History   Tobacco Use  . Smoking status: Former Smoker  Packs/day: 0.50    Years: 11.00    Pack years: 5.50    Types: Cigarettes  . Smokeless tobacco: Never Used  Substance Use Topics  . Alcohol use: No    Alcohol/week: 0.0 oz  . Drug use: No     Allergies   Patient has no known allergies.   Review of Systems Review of Systems  Constitutional: Negative for activity change, appetite change and fever.  HENT: Negative for congestion and rhinorrhea.   Eyes: Negative for photophobia and redness.  Respiratory: Negative for cough, chest tightness and shortness of breath.   Gastrointestinal: Positive for abdominal pain. Negative for constipation, diarrhea, nausea and vomiting.  Genitourinary: Positive for decreased urine volume,  difficulty urinating, frequency and urgency. Negative for dysuria, hematuria and testicular pain.  Musculoskeletal: Negative for arthralgias, back pain and myalgias.  Neurological: Negative for dizziness, weakness and headaches.   all other systems are negative except as noted in the HPI and PMH.     Physical Exam Updated Vital Signs BP (!) 146/86 (BP Location: Right Arm)   Pulse (!) 113   Temp 98 F (36.7 C) (Oral)   Resp 18   Ht 5\' 10"  (1.778 m)   Wt 108.9 kg (240 lb)   SpO2 98%   BMI 34.44 kg/m   Physical Exam  Constitutional: He is oriented to person, place, and time. He appears well-developed and well-nourished. No distress.  HENT:  Head: Normocephalic and atraumatic.  Mouth/Throat: Oropharynx is clear and moist. No oropharyngeal exudate.  Eyes: Pupils are equal, round, and reactive to light. Conjunctivae and EOM are normal.  Neck: Normal range of motion. Neck supple.  No meningismus.  Cardiovascular: Normal rate, regular rhythm, normal heart sounds and intact distal pulses.  No murmur heard. Pulmonary/Chest: Effort normal and breath sounds normal. No respiratory distress.  Abdominal: Soft. There is no tenderness. There is no rebound and no guarding.  Genitourinary:  Genitourinary Comments: Foley catheter in place draining dark yellow urine.    Musculoskeletal: Normal range of motion. He exhibits no edema or tenderness.  Neurological: He is alert and oriented to person, place, and time. No cranial nerve deficit. He exhibits normal muscle tone. Coordination normal.  No ataxia on finger to nose bilaterally. No pronator drift. 5/5 strength throughout. CN 2-12 intact.Equal grip strength. Sensation intact.   Skin: Skin is warm.  Psychiatric: He has a normal mood and affect. His behavior is normal.  Nursing note and vitals reviewed.    ED Treatments / Results  Labs (all labs ordered are listed, but only abnormal results are displayed) Labs Reviewed  URINALYSIS, ROUTINE  W REFLEX MICROSCOPIC - Abnormal; Notable for the following components:      Result Value   Hgb urine dipstick MODERATE (*)    Protein, ur 30 (*)    RBC / HPF >50 (*)    All other components within normal limits  CBC WITH DIFFERENTIAL/PLATELET - Abnormal; Notable for the following components:   WBC 16.9 (*)    Neutro Abs 15.2 (*)    All other components within normal limits  BASIC METABOLIC PANEL - Abnormal; Notable for the following components:   Sodium 134 (*)    Chloride 99 (*)    Glucose, Bld 160 (*)    Creatinine, Ser 1.28 (*)    GFR calc non Af Amer 59 (*)    All other components within normal limits  URINE CULTURE    EKG None  Radiology Ct Renal Stone Study  Result  Date: 06/23/2017 CLINICAL DATA:  Unable to void since this afternoon. Recent prostate issues. Pending prostate MR this month. EXAM: CT ABDOMEN AND PELVIS WITHOUT CONTRAST TECHNIQUE: Multidetector CT imaging of the abdomen and pelvis was performed following the standard protocol without IV contrast. COMPARISON:  07/27/2009 FINDINGS: Lower chest: Linear scarring/atelectasis over the lung bases. Hepatobiliary: Minimal cholelithiasis. Liver and biliary tree are normal. Pancreas: Normal. Spleen: Normal. Adrenals/Urinary Tract: 1 cm left adrenal nodule too small to characterize but likely an adenoma and not significantly changed. Kidneys are normal in size without hydronephrosis or nephrolithiasis. Ureters are within normal. Foley catheter is present within the bladder. Stomach/Bowel: Stomach and small bowel are normal. Appendix is normal. Colon is normal. Vascular/Lymphatic: Mild calcified plaque over the abdominal aorta. No adenopathy. Reproductive: It is difficult to differentiate bladder and prostate as there is likely prostatic enlargement compressing the collapsed bladder anteriorly and superiorly. There is an oval 3.9 cm cystic collection to the right of midline over the region of the prostate gland. Other: None.  Musculoskeletal: Mild degenerate change of the spine. IMPRESSION: No acute findings in the abdomen/pelvis. Moderate prostatic enlargement with 3.9 cm cystic collection along the right side of the prostate gland. It is difficult to separate prostate and bladder on this noncontrast exam as a Foley catheter is present within an apparent collapsed bladder. Urology protocol CT with delayed images through the bladder postcontrast may be helpful for further evaluation. Minimal cholelithiasis. Linear bibasilar atelectasis/scarring. 1 cm left adrenal nodule without significant change likely an adenoma. Aortic Atherosclerosis (ICD10-I70.0). Electronically Signed   By: Marin Olp M.D.   On: 06/23/2017 02:22    Procedures Procedures (including critical care time)  Medications Ordered in ED Medications - No data to display   Initial Impression / Assessment and Plan / ED Course  I have reviewed the triage vital signs and the nursing notes.  Pertinent labs & imaging results that were available during my care of the patient were reviewed by me and considered in my medical decision making (see chart for details).    Patient presents with urinary retention.  He feels back to baseline after Foley catheter was placed with greater than 500 cc of urine.  No abdominal pain  Urinalysis with hematuria and sent for culture.  Creatinine at baseline.  Leukocytosis noted which appears to be chronic.  With hematuria and urinary retention, CT scan obtained which shows no kidney stone or other pathology.  Does show cystic structure near prostate.  Patient follows with his urologist regarding this already.  Follow-up with urology for catheter removal and follow-up of prostate pathology.  Return precautions discussed.  Final Clinical Impressions(s) / ED Diagnoses   Final diagnoses:  Urinary retention    ED Discharge Orders    None       Mehdi Gironda, Annie Main, MD 06/23/17 [phone]

## 2017-06-22 NOTE — ED Notes (Signed)
Per Dr. Wyvonnia Dusky ok to place foley cath d/t retention and symptoms

## 2017-06-22 NOTE — ED Notes (Signed)
Scanned Pt bladder 489ml volume in bladder.

## 2017-06-22 NOTE — ED Triage Notes (Addendum)
Pt states he has been unable to empty bladder all day, small amount "dribbles" at 1600 today. Significant bladder spasms per patient.recent prostate biopsy, due to have MRI to evaluate "spot", pt appears very uncomfortable, diaphoretic, grunting.

## 2017-06-23 ENCOUNTER — Emergency Department (HOSPITAL_COMMUNITY): Payer: Self-pay

## 2017-06-23 LAB — CBC WITH DIFFERENTIAL/PLATELET
ABS IMMATURE GRANULOCYTES: 0.1 10*3/uL (ref 0.0–0.1)
BASOS PCT: 0 %
Basophils Absolute: 0.1 10*3/uL (ref 0.0–0.1)
EOS ABS: 0 10*3/uL (ref 0.0–0.7)
Eosinophils Relative: 0 %
HCT: 41.9 % (ref 39.0–52.0)
Hemoglobin: 13.6 g/dL (ref 13.0–17.0)
IMMATURE GRANULOCYTES: 1 %
Lymphocytes Relative: 6 %
Lymphs Abs: 1 10*3/uL (ref 0.7–4.0)
MCH: 27.3 pg (ref 26.0–34.0)
MCHC: 32.5 g/dL (ref 30.0–36.0)
MCV: 84 fL (ref 78.0–100.0)
MONOS PCT: 3 %
Monocytes Absolute: 0.5 10*3/uL (ref 0.1–1.0)
NEUTROS ABS: 15.2 10*3/uL — AB (ref 1.7–7.7)
NEUTROS PCT: 90 %
PLATELETS: 384 10*3/uL (ref 150–400)
RBC: 4.99 MIL/uL (ref 4.22–5.81)
RDW: 13.7 % (ref 11.5–15.5)
WBC: 16.9 10*3/uL — AB (ref 4.0–10.5)

## 2017-06-23 LAB — URINALYSIS, ROUTINE W REFLEX MICROSCOPIC
BACTERIA UA: NONE SEEN
Bilirubin Urine: NEGATIVE
Glucose, UA: NEGATIVE mg/dL
Ketones, ur: NEGATIVE mg/dL
LEUKOCYTES UA: NEGATIVE
Nitrite: NEGATIVE
PROTEIN: 30 mg/dL — AB
RBC / HPF: >50 RBC/hpf — ABNORMAL HIGH (ref 0–5)
SPECIFIC GRAVITY, URINE: 1.019 (ref 1.005–1.030)
pH: 7 (ref 5.0–8.0)

## 2017-06-23 LAB — BASIC METABOLIC PANEL
ANION GAP: 10 (ref 5–15)
BUN: 9 mg/dL (ref 6–20)
CO2: 25 mmol/L (ref 22–32)
Calcium: 9 mg/dL (ref 8.9–10.3)
Chloride: 99 mmol/L — ABNORMAL LOW (ref 101–111)
Creatinine, Ser: 1.28 mg/dL — ABNORMAL HIGH (ref 0.61–1.24)
GFR, EST NON AFRICAN AMERICAN: 59 mL/min — AB (ref >60)
GLUCOSE: 160 mg/dL — AB (ref 65–99)
Potassium: 3.5 mmol/L (ref 3.5–5.1)
SODIUM: 134 mmol/L — AB (ref 135–145)

## 2017-06-23 MED ORDER — SODIUM CHLORIDE 0.9 % IV BOLUS
1000.0000 mL | Freq: Once | INTRAVENOUS | Status: AC
  Administered 2017-06-23: 1000 mL via INTRAVENOUS

## 2017-06-23 NOTE — ED Notes (Signed)
Pt given sandwich and ginger ale since he stated he had not eaten since breakfast.

## 2017-06-23 NOTE — Discharge Instructions (Addendum)
Keep the catheter in place until follow-up with urology.  You should follow-up with them regarding the abnormality of your prostate as scheduled.  Return to the ED if you develop new or worsening symptoms.

## 2017-06-24 LAB — URINE CULTURE: CULTURE: NO GROWTH

## 2017-07-03 ENCOUNTER — Ambulatory Visit
Admission: RE | Admit: 2017-07-03 | Discharge: 2017-07-03 | Disposition: A | Discharge status: Home / Self Care | Payer: Self-pay | Source: Ambulatory Visit | Attending: Urology | Admitting: Urology

## 2017-07-03 DIAGNOSIS — R972 Elevated prostate specific antigen [PSA]: Secondary | ICD-10-CM

## 2017-07-03 MED ORDER — GADOBENATE DIMEGLUMINE 529 MG/ML IV SOLN
20.0000 mL | Freq: Once | INTRAVENOUS | Status: AC | PRN
  Administered 2017-07-03: 20 mL via INTRAVENOUS

## 2017-09-13 ENCOUNTER — Encounter (HOSPITAL_COMMUNITY): Payer: Self-pay | Admitting: Emergency Medicine

## 2017-09-13 ENCOUNTER — Emergency Department (HOSPITAL_COMMUNITY)
Admission: EM | Admit: 2017-09-13 | Discharge: 2017-09-13 | Disposition: A | Discharge status: Home / Self Care | Payer: Self-pay | Attending: Emergency Medicine | Admitting: Emergency Medicine

## 2017-09-13 DIAGNOSIS — Z8673 Personal history of transient ischemic attack (TIA), and cerebral infarction without residual deficits: Secondary | ICD-10-CM | POA: Insufficient documentation

## 2017-09-13 DIAGNOSIS — R338 Other retention of urine: Secondary | ICD-10-CM | POA: Insufficient documentation

## 2017-09-13 DIAGNOSIS — Z7984 Long term (current) use of oral hypoglycemic drugs: Secondary | ICD-10-CM | POA: Insufficient documentation

## 2017-09-13 DIAGNOSIS — I10 Essential (primary) hypertension: Secondary | ICD-10-CM | POA: Insufficient documentation

## 2017-09-13 DIAGNOSIS — Z87891 Personal history of nicotine dependence: Secondary | ICD-10-CM | POA: Insufficient documentation

## 2017-09-13 DIAGNOSIS — R011 Cardiac murmur, unspecified: Secondary | ICD-10-CM | POA: Insufficient documentation

## 2017-09-13 DIAGNOSIS — Z79899 Other long term (current) drug therapy: Secondary | ICD-10-CM | POA: Insufficient documentation

## 2017-09-13 DIAGNOSIS — E119 Type 2 diabetes mellitus without complications: Secondary | ICD-10-CM | POA: Insufficient documentation

## 2017-09-13 LAB — I-STAT CHEM 8, ED
BUN: 10 mg/dL (ref 8–23)
CALCIUM ION: 1.15 mmol/L (ref 1.15–1.40)
CHLORIDE: 102 mmol/L (ref 98–111)
Creatinine, Ser: 1.3 mg/dL — ABNORMAL HIGH (ref 0.61–1.24)
Glucose, Bld: 140 mg/dL — ABNORMAL HIGH (ref 70–99)
HCT: 40 % (ref 39.0–52.0)
Hemoglobin: 13.6 g/dL (ref 13.0–17.0)
Potassium: 3.4 mmol/L — ABNORMAL LOW (ref 3.5–5.1)
SODIUM: 140 mmol/L (ref 135–145)
TCO2: 27 mmol/L (ref 22–32)

## 2017-09-13 LAB — URINALYSIS, ROUTINE W REFLEX MICROSCOPIC
BILIRUBIN URINE: NEGATIVE
GLUCOSE, UA: NEGATIVE mg/dL
HGB URINE DIPSTICK: NEGATIVE
Ketones, ur: NEGATIVE mg/dL
Leukocytes, UA: NEGATIVE
Nitrite: NEGATIVE
PROTEIN: NEGATIVE mg/dL
Specific Gravity, Urine: 1.018 (ref 1.005–1.030)
pH: 6 (ref 5.0–8.0)

## 2017-09-13 MED ORDER — CEPHALEXIN 500 MG PO CAPS: 500.0000 mg | ORAL_CAPSULE | Freq: Three times a day (TID) | ORAL | 0 refills | Status: DC

## 2017-09-13 NOTE — ED Provider Notes (Signed)
[facility] EMERGENCY DEPARTMENT Provider Note   CSN: [id number] Arrival date & time: 09/13/17  2041     History   Chief Complaint Chief Complaint  Patient presents with  . Urinary Retention    HPI John Schaefer is a 62 y.o. male.  The history is provided by the patient and medical records. No language interpreter was used.     62 year old male with history of diabetes, erectile dysfunction, obesity, elevated PSA presenting for acute urinary retention.  Patient report approximately 5 hours ago he developed acute onset of urinary retention.  States that he was urinating fine in the late on having urge but unable to urinate.  He continues to endorse worsening pressure to this bladder region.  Pain is moderate to severe with spasm.  No report of fever or chills hematuria or dysuria prior to his symptoms.  He mentioned having urinary retention.  Recently, and have a Foley catheter placed.  He did follow-up with a urologist, Dr. Karsten Ro and had a prostate biopsy.  He is unaware of the results.  He is scheduled to follow-up with his urologist tomorrow.  He denies any recent medication changes, denies constipation, or any other infectious symptoms.  He did try taking one leftover antibiotic that was given last time without relief.  Past Medical History:  Diagnosis Date  . Hypertension   . Stroke [facility])     Patient Active Problem List   Diagnosis Date Noted  . Elevated PSA, greater than or equal to 20 ng/ml 08/16/2016  . Hyperlipidemia 08/12/2016  . BMI 37.0-37.9, adult 08/12/2016  . Erectile dysfunction 08/12/2016  . Type 2 diabetes mellitus without complication, without long-term current use of insulin ([place]) 08/12/2016  . Chronic gout 02/20/2014  . Essential hypertension 10/27/2012  . Gout 10/27/2012    History reviewed. No pertinent surgical history.      Home Medications    Prior to Admission medications   Medication Sig Start Date End Date Taking?  Authorizing Provider  allopurinol (ZYLOPRIM) 300 MG tablet TAKE 1 TABLET BY MOUTH ONCE DAILY 07/08/16   Harrison Mons, PA  amLODipine (NORVASC) 10 MG tablet TAKE 1 TABLET BY MOUTH ONCE DAILY 07/08/16   Harrison Mons, PA  amLODipine (NORVASC) 10 MG tablet Take 1 tablet (10 mg total) by mouth daily. 08/12/16   Harrison Mons, PA  atorvastatin (LIPITOR) 20 MG tablet TAKE 1 TABLET BY MOUTH ONCE DAILY 07/08/16   Harrison Mons, PA  atorvastatin (LIPITOR) 20 MG tablet Take 1 tablet (20 mg total) by mouth daily. 08/12/16   Harrison Mons, PA  losartan-hydrochlorothiazide (HYZAAR) 100-25 MG tablet TAKE 1 TABLET BY MOUTH ONCE DAILY 07/08/16   Harrison Mons, PA  losartan-hydrochlorothiazide (HYZAAR) 100-25 MG tablet Take 1 tablet by mouth daily. 08/12/16   Harrison Mons, PA  metFORMIN (GLUCOPHAGE) 500 MG tablet TAKE 1 TABLET BY MOUTH ONCE DAILY WITH  BREAKFAST.  (OFFICE  VISIT  NEEDED  FOR  LABS  FOR  REFILLS) 07/08/16   Harrison Mons, PA  metFORMIN (GLUCOPHAGE) 500 MG tablet Take 1 tablet (500 mg total) by mouth 2 (two) times daily with a meal. 08/12/16   Harrison Mons, PA  sildenafil (REVATIO) 20 MG tablet Take 1 tablet (20 mg total) by mouth 3 (three) times daily. 08/12/16   Harrison Mons, PA    Family History Family History  Problem Relation Age of Onset  . Heart disease Father 64       MI  . Diabetes Brother   .  Stroke Brother   . Heart disease Paternal Aunt     Social History Social History   Tobacco Use  . Smoking status: Former Smoker    Packs/day: 0.50    Years: 11.00    Pack years: 5.50    Types: Cigarettes  . Smokeless tobacco: Never Used  Substance Use Topics  . Alcohol use: No    Alcohol/week: 0.0 standard drinks  . Drug use: No     Allergies   Patient has no known allergies.   Review of Systems Review of Systems  All other systems reviewed and are negative.    Physical Exam Updated Vital Signs BP 132/85 (BP Location: Right Arm)   Pulse (!) 112   Temp  98 F (36.7 C) (Oral)   Resp 20   SpO2 97%   Physical Exam  Constitutional: He appears well-developed and well-nourished. No distress.  Patient appears uncomfortable.  HENT:  Head: Atraumatic.  Eyes: Conjunctivae are normal.  Neck: Neck supple.  Cardiovascular:  Murmur heard. Tachycardia without rubs or gallop  Pulmonary/Chest: Effort normal and breath sounds normal.  Abdominal: Soft. He exhibits no distension. There is no tenderness.  Genitourinary:  Genitourinary Comments: Chaperone present during exam.  Uncircumcised penis free of lesion or rash.  No inguinal lymph adenopathy or inguinal hernia noted.  Normal scrotum.  Neurological: He is alert.  Skin: No rash noted.  Psychiatric: He has a normal mood and affect.  Nursing note and vitals reviewed.    ED Treatments / Results  Labs (all labs ordered are listed, but only abnormal results are displayed) Labs Reviewed  URINALYSIS, ROUTINE W REFLEX MICROSCOPIC - Abnormal; Notable for the following components:      Result Value   Color, Urine AMBER (*)    APPearance HAZY (*)    All other components within normal limits  I-STAT CHEM 8, ED - Abnormal; Notable for the following components:   Potassium 3.4 (*)    Creatinine, Ser 1.30 (*)    Glucose, Bld 140 (*)    All other components within normal limits  URINE CULTURE    EKG None  Radiology No results found.  Procedures Procedures (including critical care time)  Medications Ordered in ED Medications - No data to display   Initial Impression / Assessment and Plan / ED Course  I have reviewed the triage vital signs and the nursing notes.  Pertinent labs & imaging results that were available during my care of the patient were reviewed by me and considered in my medical decision making (see chart for details).  Clinical Course as of Sep 14 2302  Thu Sep 14, 6330  [id number] 62 year old male with one prior history of urinary retention requiring Foley's here with  difficulty passing urine.  He said he is noticed over the last few days.  The last time they put a catheter and put on antibiotics he ended up getting a prostatic biopsy.  He denies any fever chills.  He had a Foley placed and is feeling much improved.  Is getting some screening labs and likely will be discharged with catheter in place on antibiotics.   [MB]    Clinical Course User Index [MB] Hayden Rasmussen, MD    BP 124/88   Pulse 85   Temp 98 F (36.7 C) (Oral)   Resp 20   SpO2 94%    Final Clinical Impressions(s) / ED Diagnoses   Final diagnoses:  Acute urinary retention    ED Discharge Orders  Ordered    cephALEXin (KEFLEX) 500 MG capsule  3 times daily     09/13/17 2302         9:23 PM Patient with acute urinary retention.  On bladder scan, less than 300 cc of urine noted however he appears very uncomfortable.  After insertion of Foley catheter by nursing staff, patient felt better.  11:04 PM Creatinine is 1.3 today.  Urine without signs of urinary tract infection.  Patient felt better after placement of Foley.  Urine culture sent.  Patient will call and follow-up closely with urologist for further management of his acute urinary retention.  Patient discharged home with Keflex.  Return precautions discussed.  Care discussed with DR. Butler.    Domenic Moras, PA-C 09/13/17 2305    Hayden Rasmussen, MD 09/14/17 [phone]

## 2017-09-13 NOTE — Discharge Instructions (Addendum)
Call and follow up with your urologist for further management of your urinary retention

## 2017-09-13 NOTE — ED Triage Notes (Signed)
Pt presents to ED for assessment of urinary retention since 4pm today.

## 2017-09-13 NOTE — ED Notes (Signed)
Pt left at this time with all belongings.  

## 2017-09-13 NOTE — ED Notes (Signed)
Bowie PA at bedside, to place foley. Pt obviously uncomfortable, grunting, unable to carry on conversation d/t discomfort.

## 2017-09-15 LAB — URINE CULTURE: CULTURE: NO GROWTH

## 2017-10-22 ENCOUNTER — Other Ambulatory Visit: Payer: Self-pay | Admitting: Urology

## 2017-10-22 ENCOUNTER — Telehealth: Payer: Self-pay | Admitting: Family Medicine

## 2017-10-22 DIAGNOSIS — N4283 Cyst of prostate: Secondary | ICD-10-CM

## 2017-10-22 NOTE — Telephone Encounter (Signed)
Copied from [place] [phone]. Topic: General - Other >> Oct 22, 2017  1:55 PM Burchel, John Schaefer wrote: [facility]) called to notify Dr Cooper Render that Pt is scheduled for MRI-Prostate (@ [place]) on 10/31/17.  [phone]

## 2017-10-26 ENCOUNTER — Emergency Department (HOSPITAL_COMMUNITY)
Admission: EM | Admit: 2017-10-26 | Discharge: 2017-10-26 | Disposition: A | Discharge status: Home / Self Care | Payer: Self-pay | Attending: Emergency Medicine | Admitting: Emergency Medicine

## 2017-10-26 ENCOUNTER — Other Ambulatory Visit: Payer: Self-pay

## 2017-10-26 ENCOUNTER — Encounter (HOSPITAL_COMMUNITY): Payer: Self-pay

## 2017-10-26 DIAGNOSIS — Z87891 Personal history of nicotine dependence: Secondary | ICD-10-CM | POA: Insufficient documentation

## 2017-10-26 DIAGNOSIS — H539 Unspecified visual disturbance: Secondary | ICD-10-CM | POA: Insufficient documentation

## 2017-10-26 DIAGNOSIS — Z8673 Personal history of transient ischemic attack (TIA), and cerebral infarction without residual deficits: Secondary | ICD-10-CM | POA: Insufficient documentation

## 2017-10-26 DIAGNOSIS — Z79899 Other long term (current) drug therapy: Secondary | ICD-10-CM | POA: Insufficient documentation

## 2017-10-26 DIAGNOSIS — I10 Essential (primary) hypertension: Secondary | ICD-10-CM | POA: Insufficient documentation

## 2017-10-26 DIAGNOSIS — E119 Type 2 diabetes mellitus without complications: Secondary | ICD-10-CM | POA: Insufficient documentation

## 2017-10-26 NOTE — ED Triage Notes (Signed)
Pt from home with visual changes that happened over a week ago. No neuro deficits. States he feels like there are cobwebs he cannot get to go away. A&Ox4

## 2017-10-26 NOTE — ED Provider Notes (Signed)
[facility] EMERGENCY DEPARTMENT Provider Note   CSN: [id number] Arrival date & time: 10/26/17  1547     History   Chief Complaint Chief Complaint  Patient presents with  . Visual Field Change    HPI John Schaefer is a 62 y.o. male w PMHx HTN, stroke, presenting, T2DM, presenting to the ED with complaint of 1 week of visual disturbance in his right eye.  He states that initially began when he felt like he walked through spiderweb when getting out of the car.  He states however there was no spiderweb there but that is what he feels like he is looking through.  The visual disturbance travels in the same direction of his eye when he looks around.  The visual disturbance is in the upper inner quadrant of his right eye, he feels like he is looking through a hair or eyelash.  Denies loss of vision or loss of vision in that field.  He denies eye pain, eye discharge, foreign body sensation, headache, numbness or weakness, fever.  Denies any recent head trauma.  The history is provided by the patient.    Past Medical History:  Diagnosis Date  . Hypertension   . Stroke [facility])     Patient Active Problem List   Diagnosis Date Noted  . Elevated PSA, greater than or equal to 20 ng/ml 08/16/2016  . Hyperlipidemia 08/12/2016  . BMI 37.0-37.9, adult 08/12/2016  . Erectile dysfunction 08/12/2016  . Type 2 diabetes mellitus without complication, without long-term current use of insulin ([place]) 08/12/2016  . Chronic gout 02/20/2014  . Essential hypertension 10/27/2012  . Gout 10/27/2012    History reviewed. No pertinent surgical history.      Home Medications    Prior to Admission medications   Medication Sig Start Date End Date Taking? Authorizing Provider  amLODipine (NORVASC) 10 MG tablet TAKE 1 TABLET BY MOUTH ONCE DAILY 07/08/16  Yes Jeffery, [place]le, PA  amoxicillin-clavulanate (AUGMENTIN) 500-125 MG tablet Take 1 tablet by mouth every 12 (twelve) hours. 10/18/17  Yes  [provider]  atorvastatin (LIPITOR) 20 MG tablet TAKE 1 TABLET BY MOUTH ONCE DAILY 07/08/16  Yes Jeffery, [place], PA  losartan-hydrochlorothiazide (HYZAAR) 100-25 MG tablet TAKE 1 TABLET BY MOUTH ONCE DAILY 07/08/16  Yes Jeffery, Chelle, PA  metFORMIN (GLUCOPHAGE) 500 MG tablet TAKE 1 TABLET BY MOUTH ONCE DAILY WITH  BREAKFAST.  (OFFICE  VISIT  NEEDED  FOR  LABS  FOR  REFILLS) 07/08/16  Yes Jeffery, Chelle, PA  sildenafil (REVATIO) 20 MG tablet Take 1 tablet (20 mg total) by mouth 3 (three) times daily. 08/12/16  Yes Jeffery, Domingo Mend, PA  tamsulosin (FLOMAX) 0.4 MG CAPS capsule Take 0.4 mg by mouth at bedtime. 10/15/17  Yes [provider]  allopurinol (ZYLOPRIM) 300 MG tablet TAKE 1 TABLET BY MOUTH ONCE DAILY Patient not taking: Reported on 10/26/2017 07/08/16   Harrison Mons, PA  amLODipine (NORVASC) 10 MG tablet Take 1 tablet (10 mg total) by mouth daily. Patient not taking: Reported on 10/26/2017 08/12/16   Harrison Mons, PA  atorvastatin (LIPITOR) 20 MG tablet Take 1 tablet (20 mg total) by mouth daily. Patient not taking: Reported on 10/26/2017 08/12/16   Harrison Mons, PA  cephALEXin (KEFLEX) 500 MG capsule Take 1 capsule (500 mg total) by mouth 3 (three) times daily. Patient not taking: Reported on 10/26/2017 09/13/17   Domenic Moras, PA-C  losartan-hydrochlorothiazide (HYZAAR) 100-25 MG tablet Take 1 tablet by mouth daily. Patient not taking: Reported  on 10/26/2017 08/12/16   Harrison Mons, PA  metFORMIN (GLUCOPHAGE) 500 MG tablet Take 1 tablet (500 mg total) by mouth 2 (two) times daily with a meal. Patient not taking: Reported on 10/26/2017 08/12/16   Harrison Mons, PA    Family History Family History  Problem Relation Age of Onset  . Heart disease Father 48       MI  . Diabetes Brother   . Stroke Brother   . Heart disease Paternal Aunt     Social History Social History   Tobacco Use  . Smoking status: Former Smoker    Packs/day: 0.50    Years: 11.00      Pack years: 5.50    Types: Cigarettes  . Smokeless tobacco: Never Used  Substance Use Topics  . Alcohol use: No    Alcohol/week: 0.0 standard drinks  . Drug use: No     Allergies   Patient has no known allergies.   Review of Systems Review of Systems  Eyes: Positive for visual disturbance. Negative for photophobia, pain, discharge and redness.  Neurological: Negative for weakness, numbness and headaches.  All other systems reviewed and are negative.    Physical Exam Updated Vital Signs BP 127/77   Pulse 72   Temp 98.6 F (37 C) (Oral)   Resp 16   Ht 5\' 10"  (1.778 m)   Wt 104.3 kg   SpO2 100%   BMI 33.00 kg/m   Physical Exam  Constitutional: He appears well-developed and well-nourished. No distress.  HENT:  Head: Normocephalic and atraumatic.  Eyes: Pupils are equal, round, and reactive to light. Conjunctivae, EOM and lids are normal. Right eye exhibits no discharge. No foreign body present in the right eye. Left eye exhibits no discharge. Right conjunctiva is not injected. Right conjunctiva has no hemorrhage.  Normal exam of the right eye.  Eye is not red.  No steamy cornea.  Eye is not tender or firm compared to the left.  There is no discharge or periorbital swelling.  There is no tenderness to the temporal region.  Ultrasound without obvious retinal detachment or floater in the vitreous humor.  Cardiovascular: Normal rate and regular rhythm.  Pulmonary/Chest: Effort normal and breath sounds normal.  Abdominal: Soft.  Neurological: He is alert.  Skin: Skin is warm.  Psychiatric: He has a normal mood and affect. His behavior is normal.  Nursing note and vitals reviewed.    ED Treatments / Results  Labs (all labs ordered are listed, but only abnormal results are displayed) Labs Reviewed - No data to display  EKG None  Radiology No results found.  Procedures Procedures (including critical care time) EMERGENCY DEPARTMENT [place] OCULAR EXAM "Study:  Limited Ultrasound of Orbit "  INDICATIONS: Floaters/Flashes Linear probe utilized to obtain images in both long and short axis of the orbit having the patient look left and right if possible.  PERFORMED BY: Myself IMAGES ARCHIVED?: Yes LIMITATIONS: none VIEWS USED: Right orbit INTERPRETATION: No retinal detachment, Lens in proper position    Medications Ordered in ED Medications - No data to display   Initial Impression / Assessment and Plan / ED Course  I have reviewed the triage vital signs and the nursing notes.  Pertinent labs & imaging results that were available during my care of the patient were reviewed by me and considered in my medical decision making (see chart for details).     Patient with 1 wk of small visual disturbance the right eye, in the upper  inner quadrant of his visual field.  Disturbance described as "looking through an eyelash." Pt has no vision loss or partial vision loss.  Visual acuity is actually better on the right compared to the left.  He has no eye pain, headache, or neuro symptoms.  He has a normal eye exam, eye is not red, no discharge.  Bedside ultrasound without obvious evidence of retinal detachment.  Normal neurologic exam.  Exam not concerning for acute enclosure glaucoma, stroke, temporal arteritis, foreign body.  Patient discussed with Dr. Wilson Singer.  Plan for discharge with ophthalmology referral for close follow-up.  Patient agreeable to plan and safe for discharge at this time.  Discussed results, findings, treatment and follow up. Patient advised of return precautions. Patient verbalized understanding and agreed with plan.   Final Clinical Impressions(s) / ED Diagnoses   Final diagnoses:  Visual disturbance    ED Discharge Orders    None       Athziry Millican, [place] N, PA-C 10/26/17 1845    Virgel Manifold, MD 10/26/17 2315

## 2017-10-26 NOTE — ED Notes (Signed)
Pt stable, ambulatory, states understanding of discharge instructions 

## 2017-10-26 NOTE — Discharge Instructions (Addendum)
Schedule an appointment as soon as available to follow-up with the eye doctor. Return to the emergency department if you experience vision loss of all or part of your vision, headache, eye pain, or new or concerning symptoms.

## 2017-10-31 ENCOUNTER — Ambulatory Visit
Admission: RE | Admit: 2017-10-31 | Discharge: 2017-10-31 | Disposition: A | Discharge status: Home / Self Care | Payer: No Typology Code available for payment source | Source: Ambulatory Visit | Attending: Urology | Admitting: Urology

## 2017-10-31 DIAGNOSIS — N4283 Cyst of prostate: Secondary | ICD-10-CM

## 2017-10-31 MED ORDER — GADOBENATE DIMEGLUMINE 529 MG/ML IV SOLN
20.0000 mL | Freq: Once | INTRAVENOUS | Status: AC | PRN
  Administered 2017-10-31: 20 mL via INTRAVENOUS

## 2018-03-11 ENCOUNTER — Other Ambulatory Visit: Payer: Self-pay | Admitting: Family Medicine

## 2018-03-11 DIAGNOSIS — E785 Hyperlipidemia, unspecified: Secondary | ICD-10-CM

## 2018-03-11 NOTE — Telephone Encounter (Signed)
Left message for pt to return call to office. Pt would need to be scheduled for appt with a provider in order to receive refills.

## 2018-04-11 ENCOUNTER — Telehealth: Payer: Self-pay | Admitting: Emergency Medicine

## 2018-04-11 NOTE — Telephone Encounter (Signed)
caleed pt left VM for him to call and reschedule his 10:20appt on 3.30.2020 to later in the day FR

## 2018-04-15 ENCOUNTER — Ambulatory Visit: Payer: Self-pay | Admitting: Emergency Medicine

## 2018-04-15 ENCOUNTER — Telehealth: Payer: Self-pay | Admitting: Emergency Medicine

## 2018-04-15 ENCOUNTER — Other Ambulatory Visit: Payer: Self-pay

## 2018-04-15 ENCOUNTER — Telehealth: Payer: Self-pay | Admitting: *Deleted

## 2018-04-15 NOTE — Telephone Encounter (Signed)
LVM for patient not to come in office it is a [place] visit

## 2018-04-15 NOTE — Telephone Encounter (Signed)
Called patient left message in voice mail because he miss the Telemed call. Call to reschedule.

## 2018-04-17 ENCOUNTER — Other Ambulatory Visit: Payer: Self-pay

## 2018-04-17 DIAGNOSIS — I10 Essential (primary) hypertension: Secondary | ICD-10-CM

## 2018-04-17 DIAGNOSIS — E119 Type 2 diabetes mellitus without complications: Secondary | ICD-10-CM

## 2018-04-22 ENCOUNTER — Other Ambulatory Visit: Payer: Self-pay

## 2018-04-22 ENCOUNTER — Telehealth: Payer: Self-pay | Admitting: *Deleted

## 2018-04-22 ENCOUNTER — Ambulatory Visit: Payer: No Typology Code available for payment source | Admitting: Emergency Medicine

## 2018-04-22 NOTE — Telephone Encounter (Signed)
Left message in mobile voice mail to call back to be triaged by Telemed. Patient was unavailable to be telephone triaged.

## 2018-08-12 ENCOUNTER — Other Ambulatory Visit: Payer: Self-pay | Admitting: Urology

## 2018-08-12 DIAGNOSIS — R972 Elevated prostate specific antigen [PSA]: Secondary | ICD-10-CM

## 2018-12-09 ENCOUNTER — Other Ambulatory Visit: Payer: No Typology Code available for payment source

## 2019-01-21 ENCOUNTER — Inpatient Hospital Stay: Admission: RE | Admit: 2019-01-21 | Payer: No Typology Code available for payment source | Source: Ambulatory Visit

## 2019-02-17 ENCOUNTER — Other Ambulatory Visit: Payer: No Typology Code available for payment source

## 2019-03-10 ENCOUNTER — Ambulatory Visit
Admission: RE | Admit: 2019-03-10 | Discharge: 2019-03-10 | Disposition: A | Discharge status: Home / Self Care | Payer: 59 | Source: Ambulatory Visit | Attending: Urology | Admitting: Urology

## 2019-03-10 DIAGNOSIS — R972 Elevated prostate specific antigen [PSA]: Secondary | ICD-10-CM

## 2019-03-10 MED ORDER — GADOBENATE DIMEGLUMINE 529 MG/ML IV SOLN
20.0000 mL | Freq: Once | INTRAVENOUS | Status: AC | PRN
  Administered 2019-03-10: 20 mL via INTRAVENOUS

## 2019-04-10 ENCOUNTER — Other Ambulatory Visit (HOSPITAL_COMMUNITY): Payer: Self-pay | Admitting: Urology

## 2019-04-10 DIAGNOSIS — C61 Malignant neoplasm of prostate: Secondary | ICD-10-CM

## 2019-04-25 ENCOUNTER — Encounter (HOSPITAL_COMMUNITY): Payer: 59

## 2019-04-25 ENCOUNTER — Other Ambulatory Visit (HOSPITAL_COMMUNITY): Payer: 59

## 2019-04-29 ENCOUNTER — Other Ambulatory Visit: Payer: Self-pay

## 2019-04-29 ENCOUNTER — Encounter (HOSPITAL_COMMUNITY)
Admission: RE | Admit: 2019-04-29 | Discharge: 2019-04-29 | Disposition: A | Discharge status: Home / Self Care | Payer: 59 | Source: Ambulatory Visit | Attending: Urology | Admitting: Urology

## 2019-04-29 DIAGNOSIS — C61 Malignant neoplasm of prostate: Secondary | ICD-10-CM | POA: Diagnosis present

## 2019-04-29 MED ORDER — TECHNETIUM TC 99M MEDRONATE IV KIT
18.0000 | PACK | Freq: Once | INTRAVENOUS | Status: AC | PRN
  Administered 2019-04-29: 18 via INTRAVENOUS

## 2019-09-03 ENCOUNTER — Other Ambulatory Visit: Payer: Self-pay | Admitting: Urology

## 2019-09-05 ENCOUNTER — Other Ambulatory Visit: Payer: Self-pay | Admitting: Urology

## 2019-09-29 NOTE — Patient Instructions (Addendum)
DUE TO COVID-19 ONLY ONE VISITOR IS ALLOWED TO COME WITH YOU AND STAY IN THE WAITING ROOM ONLY DURING PRE OP AND PROCEDURE DAY OF SURGERY. THE 1 VISITOR  MAY VISIT WITH YOU AFTER SURGERY IN YOUR PRIVATE ROOM DURING VISITING HOURS ONLY!  YOU NEED TO HAVE A COVID 19 TEST ON: 10/02/19 @ 12:00 pm, THIS TEST MUST BE DONE BEFORE SURGERY,  COVID TESTING SITE [place] [id number], IT IS ON THE RIGHT GOING OUT [address] APPROXIMATELY  2 MINUTES PAST [employer] ON THE RIGHT. ONCE YOUR COVID TEST IS COMPLETED,  PLEASE BEGIN THE QUARANTINE INSTRUCTIONS AS OUTLINED IN YOUR HANDOUT.                John Schaefer    Your procedure is scheduled on: 10/06/19   Report to [facility] Main  Entrance   Report to short stay at: 5:30 AM     Call this number if you have problems the morning of surgery [phone]    Remember: Do not eat food or drink liquids :After Midnight.   BRUSH YOUR TEETH MORNING OF SURGERY AND RINSE YOUR MOUTH OUT, NO CHEWING GUM CANDY OR MINTS.     Take these medicines the morning of surgery with A SIP OF WATER: amlodipine,proscar.  How to Manage Your Diabetes Before and After Surgery  Why is it important to control my blood sugar before and after surgery? . Improving blood sugar levels before and after surgery helps healing and can limit problems. . A way of improving blood sugar control is eating a healthy diet by: o  Eating less sugar and carbohydrates o  Increasing activity/exercise o  Talking with your doctor about reaching your blood sugar goals . High blood sugars (greater than 180 mg/dL) can raise your risk of infections and slow your recovery, so you will need to focus on controlling your diabetes during the weeks before surgery. . Make sure that the doctor who takes care of your diabetes knows about your planned surgery including the date and location.  How do I manage my blood sugar before surgery? . Check your blood sugar at  least 4 times a day, starting 2 days before surgery, to make sure that the level is not too high or low. o Check your blood sugar the morning of your surgery when you wake up and every 2 hours until you get to the Short Stay unit. . If your blood sugar is less than 70 mg/dL, you will need to treat for low blood sugar: o Do not take insulin. o Treat a low blood sugar (less than 70 mg/dL) with  cup of clear juice (cranberry or apple), 4 glucose tablets, OR glucose gel. o Recheck blood sugar in 15 minutes after treatment (to make sure it is greater than 70 mg/dL). If your blood sugar is not greater than 70 mg/dL on recheck, call [phone] for further instructions. . Report your blood sugar to the short stay nurse when you get to Short Stay.  . If you are admitted to the hospital after surgery: o Your blood sugar will be checked by the staff and you will probably be given insulin after surgery (instead of oral diabetes medicines) to make sure you have good blood sugar levels. o The goal for blood sugar control after surgery is 80-180 mg/dL.   WHAT DO I DO ABOUT MY DIABETES MEDICATION?  Marland Kitchen Do not take oral diabetes medicines (pills) the morning of surgery.  Marland Kitchen  THE DAY BEFORE SURGERY, take Metformin as usual.     . THE MORNING OF SURGERY, DO NOT take Metformin.    DO NOT TAKE ANY DIABETIC MEDICATIONS DAY OF YOUR SURGERY                               You may not have any metal on your body including hair pins and              piercings  Do not wear jewelry, lotions, powders or perfumes, deodorant             Men may shave face and neck.   Do not bring valuables to the hospital. [place].  Contacts, dentures or bridgework may not be worn into surgery.  Leave suitcase in the car. After surgery it may be brought to your room.     Patients discharged the day of surgery will not be allowed to drive home. IF YOU ARE HAVING SURGERY AND GOING  HOME THE SAME DAY, YOU MUST HAVE AN ADULT TO DRIVE YOU HOME AND BE WITH YOU FOR 24 HOURS. YOU MAY GO HOME BY TAXI OR UBER OR ORTHERWISE, BUT AN ADULT MUST ACCOMPANY YOU HOME AND STAY WITH YOU FOR 24 HOURS.  Name and phone number of your driver:  Special Instructions: N/A              Please read over the following fact sheets you were given: _____________________________________________________________________        [facility] - Preparing for Surgery Before surgery, you can play an important role.  Because skin is not sterile, your skin needs to be as free of germs as possible.  You can reduce the number of germs on your skin by washing with CHG (chlorahexidine gluconate) soap before surgery.  CHG is an antiseptic cleaner which kills germs and bonds with the skin to continue killing germs even after washing. Please DO NOT use if you have an allergy to CHG or antibacterial soaps.  If your skin becomes reddened/irritated stop using the CHG and inform your nurse when you arrive at Short Stay. Do not shave (including legs and underarms) for at least 48 hours prior to the first CHG shower.  You may shave your face/neck. Please follow these instructions carefully:  1.  Shower with CHG Soap the night before surgery and the  morning of Surgery.  2.  If you choose to wash your hair, wash your hair first as usual with your  normal  shampoo.  3.  After you shampoo, rinse your hair and body thoroughly to remove the  shampoo.                           4.  Use CHG as you would any other liquid soap.  You can apply chg directly  to the skin and wash                       Gently with a scrungie or clean washcloth.  5.  Apply the CHG Soap to your body ONLY FROM THE NECK DOWN.   Do not use on face/ open  Wound or open sores. Avoid contact with eyes, ears mouth and genitals (private parts).                       Wash face,  Genitals (private parts) with your normal soap.             6.   Wash thoroughly, paying special attention to the area where your surgery  will be performed.  7.  Thoroughly rinse your body with warm water from the neck down.  8.  DO NOT shower/wash with your normal soap after using and rinsing off  the CHG Soap.                9.  Pat yourself dry with a clean towel.            10.  Wear clean pajamas.            11.  Place clean sheets on your bed the night of your first shower and do not  sleep with pets. Day of Surgery : Do not apply any lotions/deodorants the morning of surgery.  Please wear clean clothes to the hospital/surgery center.  FAILURE TO FOLLOW THESE INSTRUCTIONS MAY RESULT IN THE CANCELLATION OF YOUR SURGERY PATIENT SIGNATURE_________________________________  NURSE SIGNATURE__________________________________  ________________________________________________________________________   Adam Phenix  An incentive spirometer is a tool that can help keep your lungs clear and active. This tool measures how well you are filling your lungs with each breath. Taking long deep breaths may help reverse or decrease the chance of developing breathing (pulmonary) problems (especially infection) following:  A long period of time when you are unable to move or be active. BEFORE THE PROCEDURE   If the spirometer includes an indicator to show your best effort, your nurse or respiratory therapist will set it to a desired goal.  If possible, sit up straight or lean slightly forward. Try not to slouch.  Hold the incentive spirometer in an upright position. INSTRUCTIONS FOR USE  1. Sit on the edge of your bed if possible, or sit up as far as you can in bed or on a chair. 2. Hold the incentive spirometer in an upright position. 3. Breathe out normally. 4. Place the mouthpiece in your mouth and seal your lips tightly around it. 5. Breathe in slowly and as deeply as possible, raising the piston or the ball toward the top of the column. 6. Hold  your breath for 3-5 seconds or for as long as possible. Allow the piston or ball to fall to the bottom of the column. 7. Remove the mouthpiece from your mouth and breathe out normally. 8. Rest for a few seconds and repeat Steps 1 through 7 at least 10 times every 1-2 hours when you are awake. Take your time and take a few normal breaths between deep breaths. 9. The spirometer may include an indicator to show your best effort. Use the indicator as a goal to work toward during each repetition. 10. After each set of 10 deep breaths, practice coughing to be sure your lungs are clear. If you have an incision (the cut made at the time of surgery), support your incision when coughing by placing a pillow or rolled up towels firmly against it. Once you are able to get out of bed, walk around indoors and cough well. You may stop using the incentive spirometer when instructed by your caregiver.  RISKS AND COMPLICATIONS  Take your time so you do not  get dizzy or light-headed.  If you are in pain, you may need to take or ask for pain medication before doing incentive spirometry. It is harder to take a deep breath if you are having pain. AFTER USE  Rest and breathe slowly and easily.  It can be helpful to keep track of a log of your progress. Your caregiver can provide you with a simple table to help with this. If you are using the spirometer at home, follow these instructions: [place] IF:   You are having difficultly using the spirometer.  You have trouble using the spirometer as often as instructed.  Your pain medication is not giving enough relief while using the spirometer.  You develop fever of 100.5 F (38.1 C) or higher. SEEK IMMEDIATE MEDICAL CARE IF:   You cough up bloody sputum that had not been present before.  You develop fever of 102 F (38.9 C) or greater.  You develop worsening pain at or near the incision site. MAKE SURE YOU:   Understand these instructions.  Will  watch your condition.  Will get help right away if you are not doing well or get worse. Document Released: 05/15/2006 Document Revised: 03/27/2011 Document Reviewed: 07/16/2006 [facility] Patient Information 2014 [place], [place].   ________________________________________________________________________

## 2019-09-30 ENCOUNTER — Encounter (HOSPITAL_COMMUNITY): Payer: Self-pay

## 2019-09-30 ENCOUNTER — Other Ambulatory Visit: Payer: Self-pay

## 2019-09-30 ENCOUNTER — Encounter (HOSPITAL_COMMUNITY)
Admission: RE | Admit: 2019-09-30 | Discharge: 2019-09-30 | Disposition: A | Discharge status: Home / Self Care | Payer: 59 | Source: Ambulatory Visit | Attending: Urology | Admitting: Urology

## 2019-09-30 DIAGNOSIS — Z01818 Encounter for other preprocedural examination: Secondary | ICD-10-CM | POA: Insufficient documentation

## 2019-09-30 HISTORY — DX: Unspecified osteoarthritis, unspecified site: M19.90

## 2019-09-30 HISTORY — DX: Malignant (primary) neoplasm, unspecified: C80.1

## 2019-09-30 LAB — CBC
HCT: 43 % (ref 39.0–52.0)
Hemoglobin: 14.4 g/dL (ref 13.0–17.0)
MCH: 28.9 pg (ref 26.0–34.0)
MCHC: 33.5 g/dL (ref 30.0–36.0)
MCV: 86.2 fL (ref 80.0–100.0)
Platelets: 280 10*3/uL (ref 150–400)
RBC: 4.99 MIL/uL (ref 4.22–5.81)
RDW: 13.7 % (ref 11.5–15.5)
WBC: 10.5 10*3/uL (ref 4.0–10.5)
nRBC: 0 % (ref 0.0–0.2)

## 2019-09-30 LAB — BASIC METABOLIC PANEL
Anion gap: 14 (ref 5–15)
BUN: 13 mg/dL (ref 8–23)
CO2: 29 mmol/L (ref 22–32)
Calcium: 9.8 mg/dL (ref 8.9–10.3)
Chloride: 98 mmol/L (ref 98–111)
Creatinine, Ser: 1.27 mg/dL — ABNORMAL HIGH (ref 0.61–1.24)
GFR calc Af Amer: >60 mL/min (ref >60)
GFR calc non Af Amer: 60 mL/min — ABNORMAL LOW (ref >60)
Glucose, Bld: 105 mg/dL — ABNORMAL HIGH (ref 70–99)
Potassium: 3.7 mmol/L (ref 3.5–5.1)
Sodium: 141 mmol/L (ref 135–145)

## 2019-09-30 LAB — HEMOGLOBIN A1C
Hgb A1c MFr Bld: 6.1 % — ABNORMAL HIGH (ref 4.8–5.6)
Mean Plasma Glucose: 128.37 mg/dL

## 2019-09-30 NOTE — Progress Notes (Signed)
COVID Vaccine Completed: Yes Date COVID Vaccine completed: 05/2019 COVID vaccine manufacturer:     Moderna    PCP - Dr. Vassie Moment. Cardiologist - NO  Chest x-ray -  EKG -  Stress Test -  ECHO -  Cardiac Cath -  Pacemaker/ICD device last checked:  Sleep Study -  CPAP -   Fasting Blood Sugar - N/A Checks Blood Sugar ___0__ times a day  Blood Thinner Instructions: Aspirin Instructions: Last Dose:  Anesthesia review: Hx: HTN,DIA,CVA  Patient denies shortness of breath, fever, cough and chest pain at PAT appointment   Patient verbalized understanding of instructions that were given to them at the PAT appointment. Patient was also instructed that they will need to review over the PAT instructions again at home before surgery.

## 2019-10-02 ENCOUNTER — Other Ambulatory Visit (HOSPITAL_COMMUNITY)
Admission: RE | Admit: 2019-10-02 | Discharge: 2019-10-02 | Disposition: A | Discharge status: Home / Self Care | Payer: 59 | Source: Ambulatory Visit | Attending: Urology | Admitting: Urology

## 2019-10-02 DIAGNOSIS — Z20822 Contact with and (suspected) exposure to covid-19: Secondary | ICD-10-CM | POA: Diagnosis not present

## 2019-10-02 DIAGNOSIS — Z01812 Encounter for preprocedural laboratory examination: Secondary | ICD-10-CM | POA: Diagnosis present

## 2019-10-02 LAB — SARS CORONAVIRUS 2 (TAT 6-24 HRS): SARS Coronavirus 2: NEGATIVE

## 2019-10-03 NOTE — H&P (Signed)
CC: Prostate Cancer   John Schaefer is a 64 year old gentleman with a long standing history of an elevated PSA. His PSA was over 19 in 2011 and decreased to 13 after empiric antibiotics. He was recommended to follow up to recheck this but did not. He again presented in 2018 with a further rise of his PSA to 30.6. This prompted a prostate biopsy that demonstrated atypia but no malignancy in November 2018. His PSA further increased to 41.9 and he underwent an MRI of the prostate on 02/18/19. This indicated a PI-RADS 4 lesion measuring 2.4 cm at the right base. An MR/US fusion biopsy was performed on 04/01/19 with Gleason 3+4=7 adenocarcinoma diagnosed with 2 out of 15 biopsies positive for malignancy but all MR targeted biopsies negative.   He is known to have an extremely enlarged prostate measuring over 260 cc but had minimal LUTS.   He has a history of hematuria in 2011 with CT imaging and cystoscopy performed at that time with no concerning etiology identified.   He returns today in preparation to proceed with surgery in September. He denies any new changes in his medical history. He is not on any new medications.   Family history: None.   Imaging studies:  MRI (2/2/1): No EPE, SVI, or LAD.  Bone scan (04/29/19): Negative for metastatic disease.   PMH: He has a history of diabetes, hypertension, and hyperlipidemia.  PSH: No abdominal surgeries.   TNM stage: cT1c N0 M0  PSA: 41.9  Gleason score: 3+4=7  Biopsy (04/01/19): 2/15 cores positive  Left: L mid (5%, 3+3=6)  Right: R base (15%, 3+4=7)  MR targets: Benign  Prostate volume: 269 cc  PSAD: 0.16   Nomogram  OC disease: 29%  EPE: 67%  SVI: 5%  LNI: 10%  PFS (5 year, 10 year): 56%, 40%   Urinary function: IPSS is 20. He takes tamsulosin. He does have a history of urinary retention in the past.  Erectile function: SHIM score is 18. He does take sildenafil which is helpful.     ALLERGIES: No Allergies    MEDICATIONS: Finasteride 5  mg tablet 1 tablet PO Daily  Metformin Hcl 500 mg tablet  Sildenafil Citrate 20 mg tablet TAKE 1 OR 2 TABLETS BY MOUTH EVERY DAY AS NEEDED  Sildenafil Citrate 20 mg tablet TAKE 1 OR 2 TABLETS BY MOUTH EVERY DAY AS NEEDED  Sildenafil Citrate 20 mg tablet 1-2 tablet PO Daily PRN  Tamsulosin Hcl 0.4 mg capsule 1 capsule PO Q HS  Amlodipine Besylate 10 mg tablet  Atorvastatin Calcium 20 mg tablet  B Complex  Garlic  Losartan-Hydrochlorothiazide 100 mg-25 mg tablet     GU PSH: Locm 300-399Mg /Ml Iodine,1Ml - 04/15/2019 Prostate Needle Biopsy - 04/01/2019, 2018       PSH Notes: No Surgical Problems   NON-GU PSH: Surgical Pathology, Gross And Microscopic Examination For Prostate Needle - 04/01/2019, 2018     GU PMH: Urge incontinence - 05/27/2019 Weak Urinary Stream - 05/06/2019 Prostate Cancer - 04/08/2019 Elevated PSA - 04/01/2019, Due to his markedly elevated PSA and PI-RADS 4 lesion on his MRI scan the decision was made to proceed with further evaluation with a fusion biopsy., - 03/19/2019 (Worsening), His PSA has risen even further it is now 37.2 but I again found no worrisome findings on his DRE. A still think that this is inflammatory in origin but have recommended we reimage him again in 6 months with an, - 06/11/2018, I suspect his elevated PSA  is probably from the area within the prostate. No other abnormality was noted on his MRI scan saw, - 2019 (Stable), I went over his pathology report with him today. He has revealed a single core of atypia but no evidence of prostate cancer. He has a very large prostate which could account for some of his PSA elevation. I told him that my recommendation would be to follow his PSA and DRE and if it continues to rise my recommendation would be to obtain an MRI scan of the prostate., - 2018 (Stable), He had a very large prostate. The capsule appeared intact. He had an odd appearing areas of hyper echogenicity seen on the right-hand side in the mid prostate., -  2018, He has a markedly elevated PSA. I have recommended proceeding with a prostate biopsy. I was unable to palpate all of his prostate to determine whether any nodularity or induration exists., - 2018, Elevated prostate specific antigen (PSA), - 2014 BPH w/o LUTS (Stable), He has marked BPH on exam with minimal voiding symptoms. - 06/11/2018 Epididymo-orchitis - 04/25/2018 Incomplete bladder emptying, I do note he is not completely emptying his bladder but he is not having significant voiding symptoms despite this fact. I think it is likely secondary to the large size of his prostate but also possibly due to recurrence of this prostatic cyst/possible abscess. - 10/18/2017 Urinary Retention (Stable), His retention has resolved but he still has an elevated PVR. - 10/18/2017, - 10/02/2017 (Improving), His retention has now resolved. He is voiding without difficulty., - 2019, - 2019, - 2019 Acute Cystitis/UTI - 10/02/2017 ED due to arterial insufficiency, He would like to proceed with sildenafil and therefore prescription was sent to his pharmacy. - 2019 Prostate Cyst - 2019 Urinary Hesitancy, He has some hesitancy but otherwise does not have any significant obstructive voiding symptoms. - 2018 BPH w/LUTS, Benign prostatic hyperplasia with urinary obstruction - 2014 Atypia of the prostate      PMH Notes: Prostate cancer: PSA 7/18 - 31. DRE - unable to palpate majority due to body habitus.  TRUS/BX 11/30/16: Prostate volume - 261 cc  Pathology: Atypia in th e right apex laterally with no evidence of prostate cancer.  Prostate MRI 02/18/19: PI-RADS 4 lesion 2.4 cm located along the right base. Prostate volume - 246 cc  TRUS/Bx 04/01/19: PSA - 41.9, prostate volume - 269 cc  Pathology: Adenocarcinoma 3+4 in 1 core 15% and 3+3 in 1 core 5%.  Stage: T1c (intermediate - high risk)    Gross hematuria: The patient reported that he had an episode of gross hematuria that began when he was at work. This occurred for  about 12 hours and then completely cleared. He reported he had never seen gross hematuria in the past and has no history of kidney stones although he has a brother who has had kidney stones. When the hematuria was occurring he was not experiencing any flank pain, dysuria or change in his voiding pattern. He denied any history of renal trauma and has no prior history of UTIs or prostatitis. His creatinine was checked and noted to be slightly elevated at 1.6.  Cystoscopy and CT scan in 7/11 were negative for any abnormality other than some mild inflammation of the prostate.   He was seen in the emergency room on 05/16/16 with a 4 day history of increasing left testicular pain. A scrotal ultrasound was performed and revealed a small left testicular cyst of no clinical significance as well as a moderate left  hydrocele and a swollen left epididymis that was hyperemic consistent with epididymitis.   NON-GU PMH: Muscle weakness (generalized) - 05/27/2019 Pyuria/other UA findings, He had pyuria and bacteriuria again today. I am going to reculture his urine and empirically have restarted his Augmentin. - 10/18/2017 Eccrine sweat disorder, unspecified, Bilateral, Malodorous feet - 2018 Personal history of nicotine dependence, Former Smoker - 2014 Gout Hypertension Stroke/TIA    FAMILY HISTORY: Death - Father, Mother Diabetes - Mother Hypertension - Father, Mother   SOCIAL HISTORY: Marital Status: Single Preferred Language: English; Ethnicity: Not Hispanic Or Latino; Race: Black or African American Current Smoking Status: Patient does not smoke anymore. Smoked for 6 years.   Tobacco Use Assessment Completed: Used Tobacco in last 30 days? Does not drink anymore.  Does not drink caffeine. Patient's occupation is/was part time work.     Notes: Marital History - Single, Alcohol Use, Caffeine Use   REVIEW OF SYSTEMS:    GU Review Male:   Patient denies frequent urination, hard to postpone urination,  burning/ pain with urination, get up at night to urinate, leakage of urine, stream starts and stops, trouble starting your streams, and have to strain to urinate .  Gastrointestinal (Lower):   Patient denies diarrhea and constipation.  Gastrointestinal (Upper):   Patient denies nausea and vomiting.  Constitutional:   Patient denies fever, night sweats, weight loss, and fatigue.  Skin:   Patient denies itching and skin rash/ lesion.  Eyes:   Patient denies blurred vision and double vision.  Ears/ Nose/ Throat:   Patient denies sore throat and sinus problems.  Hematologic/Lymphatic:   Patient denies swollen glands and easy bruising.  Cardiovascular:   Patient denies leg swelling and chest pains.  Respiratory:   Patient denies cough and shortness of breath.  Endocrine:   Patient denies excessive thirst.  Musculoskeletal:   Patient denies back pain and joint pain.  Neurological:   Patient denies headaches and dizziness.  Psychologic:   Patient denies depression and anxiety.   VITAL SIGNS:     Weight 246 lb / 111.58 kg  Height 70 in / 177.8 cm  BMI 35.3 kg/m     MULTI-SYSTEM PHYSICAL EXAMINATION:    Constitutional: Well-nourished. No physical deformities. Normally developed. Good grooming.  Neck: Neck symmetrical, not swollen. Normal tracheal position.  Respiratory: No labored breathing, no use of accessory muscles. Clear bilaterally.  Cardiovascular: Normal temperature, normal extremity pulses, no swelling, no varicosities. RRR.  Lymphatic: No enlargement of neck, axillae, groin.  Skin: No paleness, no jaundice, no cyanosis. No lesion, no ulcer, no rash.  Neurologic / Psychiatric: Oriented to time, oriented to place, oriented to person. No depression, no anxiety, no agitation.  Gastrointestinal: No mass, no tenderness, no rigidity, non obese abdomen.  Eyes: Normal conjunctivae. Normal eyelids.  Ears, Nose, Mouth, and Throat: Left ear no scars, no lesions, no masses. Right ear no scars,  no lesions, no masses. Nose no scars, no lesions, no masses. Normal hearing. Normal lips.  Musculoskeletal: Normal gait and station of head and neck.     Complexity of Data:  Lab Test Review:   PSA  Records Review:   Previous Patient Records  X-Ray Review: MRI Prostate GSORAD: Reviewed Films.  Bone Scan: Reviewed Films.     08/19/19 01/24/19 06/06/18 11/02/16 08/12/16 09/08/09 07/14/09  PSA  Total PSA 36.10 ng/mL 41.9 ng/dl 37.2 ng/dl 30.60 ng/mL 31.0 ng/dl 13.00  19.20        ASSESSMENT:      ICD-10  Details  1 GU:   Prostate Cancer - C61    PLAN:           1. Prostate cancer: He reaffirmed his decision to proceed with surgical therapy. He will plan to proceed with a bilateral nerve-sparing robot assisted laparoscopic radical prostatectomy and bilateral pelvic lymphadenectomy. I discussed the potential benefits and risks of the procedure, side effects of the proposed treatment, the likelihood of the patient achieving the goals of the procedure, and any potential problems that might occur during the procedure or recuperation. He gives informed consent to proceed.

## 2019-10-05 NOTE — Anesthesia Preprocedure Evaluation (Addendum)
Anesthesia Evaluation  Patient identified by MRN, date of birth, ID band Patient awake    Reviewed: Allergy & Precautions, NPO status , Patient's Chart, lab work & pertinent test results  History of Anesthesia Complications Negative for: history of anesthetic complications  Airway Mallampati: III  TM Distance: >3 FB Neck ROM: Full    Dental  (+) Dental Advisory Given, Poor Dentition, Missing, Chipped   Pulmonary former smoker,    Pulmonary exam normal        Cardiovascular hypertension, Pt. on medications Normal cardiovascular exam     Neuro/Psych CVA, Residual Symptoms negative psych ROS   GI/Hepatic negative GI ROS, Neg liver ROS,   Endo/Other  diabetes, Type 2, Oral Hypoglycemic Agents Obesity   Renal/GU Renal InsufficiencyRenal disease    Prostate cancer     Musculoskeletal  (+) Arthritis ,   Abdominal   Peds  Hematology negative hematology ROS (+)   Anesthesia Other Findings Covid test negative   Reproductive/Obstetrics                            Anesthesia Physical Anesthesia Plan  ASA: III  Anesthesia Plan: General   Post-op Pain Management:    Induction: Intravenous  PONV Risk Score and Plan: 4 or greater and Treatment may vary due to age or medical condition, Ondansetron, Dexamethasone, Midazolam and Scopolamine patch - Pre-op  Airway Management Planned: Oral ETT  Additional Equipment: None  Intra-op Plan:   Post-operative Plan: Extubation in OR  Informed Consent: I have reviewed the patients History and Physical, chart, labs and discussed the procedure including the risks, benefits and alternatives for the proposed anesthesia with the patient or authorized representative who has indicated his/her understanding and acceptance.     Dental advisory given  Plan Discussed with: CRNA and Anesthesiologist  Anesthesia Plan Comments:        Anesthesia Quick  Evaluation

## 2019-10-06 ENCOUNTER — Encounter (HOSPITAL_COMMUNITY): Payer: Self-pay | Admitting: Urology

## 2019-10-06 ENCOUNTER — Ambulatory Visit (HOSPITAL_COMMUNITY): Payer: 59 | Admitting: Physician Assistant

## 2019-10-06 ENCOUNTER — Ambulatory Visit (HOSPITAL_COMMUNITY): Payer: 59 | Admitting: Anesthesiology

## 2019-10-06 ENCOUNTER — Other Ambulatory Visit: Payer: Self-pay

## 2019-10-06 ENCOUNTER — Observation Stay (HOSPITAL_COMMUNITY)
Admission: RE | Admit: 2019-10-06 | Discharge: 2019-10-07 | Disposition: A | Discharge status: Home / Self Care | Payer: 59 | Attending: Urology | Admitting: Urology

## 2019-10-06 ENCOUNTER — Encounter (HOSPITAL_COMMUNITY)
Admission: RE | Disposition: A | Discharge status: Home / Self Care | Payer: Self-pay | Source: Home / Self Care | Attending: Urology

## 2019-10-06 DIAGNOSIS — Z8673 Personal history of transient ischemic attack (TIA), and cerebral infarction without residual deficits: Secondary | ICD-10-CM | POA: Insufficient documentation

## 2019-10-06 DIAGNOSIS — C61 Malignant neoplasm of prostate: Secondary | ICD-10-CM | POA: Diagnosis not present

## 2019-10-06 DIAGNOSIS — E785 Hyperlipidemia, unspecified: Secondary | ICD-10-CM | POA: Insufficient documentation

## 2019-10-06 DIAGNOSIS — M109 Gout, unspecified: Secondary | ICD-10-CM | POA: Insufficient documentation

## 2019-10-06 DIAGNOSIS — Z87891 Personal history of nicotine dependence: Secondary | ICD-10-CM | POA: Diagnosis not present

## 2019-10-06 DIAGNOSIS — E119 Type 2 diabetes mellitus without complications: Secondary | ICD-10-CM | POA: Diagnosis not present

## 2019-10-06 DIAGNOSIS — R310 Gross hematuria: Secondary | ICD-10-CM | POA: Diagnosis present

## 2019-10-06 DIAGNOSIS — Z79899 Other long term (current) drug therapy: Secondary | ICD-10-CM | POA: Insufficient documentation

## 2019-10-06 DIAGNOSIS — Z7984 Long term (current) use of oral hypoglycemic drugs: Secondary | ICD-10-CM | POA: Insufficient documentation

## 2019-10-06 DIAGNOSIS — I10 Essential (primary) hypertension: Secondary | ICD-10-CM | POA: Insufficient documentation

## 2019-10-06 HISTORY — PX: ROBOT ASSISTED LAPAROSCOPIC RADICAL PROSTATECTOMY: SHX5141

## 2019-10-06 HISTORY — PX: LYMPHADENECTOMY: SHX5960

## 2019-10-06 LAB — TYPE AND SCREEN
ABO/RH(D): O POS
Antibody Screen: NEGATIVE

## 2019-10-06 LAB — GLUCOSE, CAPILLARY
Glucose-Capillary: 112 mg/dL — ABNORMAL HIGH (ref 70–99)
Glucose-Capillary: 130 mg/dL — ABNORMAL HIGH (ref 70–99)
Glucose-Capillary: 154 mg/dL — ABNORMAL HIGH (ref 70–99)
Glucose-Capillary: 169 mg/dL — ABNORMAL HIGH (ref 70–99)

## 2019-10-06 LAB — ABO/RH: ABO/RH(D): O POS

## 2019-10-06 LAB — HEMOGLOBIN AND HEMATOCRIT, BLOOD
HCT: 41.5 % (ref 39.0–52.0)
Hemoglobin: 13.9 g/dL (ref 13.0–17.0)

## 2019-10-06 SURGERY — XI ROBOTIC ASSISTED LAPAROSCOPIC RADICAL PROSTATECTOMY LEVEL 3
Anesthesia: General | Site: Abdomen

## 2019-10-06 MED ORDER — HYDROCHLOROTHIAZIDE 25 MG PO TABS
25.0000 mg | ORAL_TABLET | Freq: Every day | ORAL | Status: DC
  Administered 2019-10-06 – 2019-10-07 (×2): 25 mg via ORAL
  Filled 2019-10-06 (×2): qty 1

## 2019-10-06 MED ORDER — FLEET ENEMA 7-19 GM/118ML RE ENEM: 1.0000 | ENEMA | Freq: Once | RECTAL | Status: DC

## 2019-10-06 MED ORDER — CHLORHEXIDINE GLUCONATE 0.12 % MT SOLN
15.0000 mL | Freq: Once | OROMUCOSAL | Status: AC
  Administered 2019-10-06: 15 mL via OROMUCOSAL

## 2019-10-06 MED ORDER — LACTATED RINGERS IV SOLN: INTRAVENOUS | Status: DC

## 2019-10-06 MED ORDER — FENTANYL CITRATE (PF) 100 MCG/2ML IJ SOLN
INTRAMUSCULAR | Status: DC | PRN
Start: 2019-10-06 — End: 2019-10-06
  Administered 2019-10-06: 100 ug via INTRAVENOUS
  Administered 2019-10-06 (×2): 50 ug via INTRAVENOUS

## 2019-10-06 MED ORDER — TRAMADOL HCL 50 MG PO TABS: 50.0000 mg | ORAL_TABLET | Freq: Four times a day (QID) | ORAL | 0 refills | Status: DC | PRN

## 2019-10-06 MED ORDER — INDIGOTINDISULFONATE SODIUM 8 MG/ML IJ SOLN
INTRAMUSCULAR | Status: DC | PRN
  Administered 2019-10-06: 5 mL via INTRAVENOUS

## 2019-10-06 MED ORDER — CEFAZOLIN SODIUM-DEXTROSE 1-4 GM/50ML-% IV SOLN
1.0000 g | Freq: Three times a day (TID) | INTRAVENOUS | Status: AC
  Administered 2019-10-06 – 2019-10-07 (×2): 1 g via INTRAVENOUS
  Filled 2019-10-06 (×3): qty 50

## 2019-10-06 MED ORDER — FENTANYL CITRATE (PF) 250 MCG/5ML IJ SOLN
INTRAMUSCULAR | Status: AC
  Filled 2019-10-06: qty 5

## 2019-10-06 MED ORDER — SUCCINYLCHOLINE CHLORIDE 20 MG/ML IJ SOLN
INTRAMUSCULAR | Status: DC | PRN
  Administered 2019-10-06: 120 mg via INTRAVENOUS

## 2019-10-06 MED ORDER — PNEUMOCOCCAL VAC POLYVALENT 25 MCG/0.5ML IJ INJ
0.5000 mL | INJECTION | INTRAMUSCULAR | Status: DC
  Filled 2019-10-06: qty 0.5

## 2019-10-06 MED ORDER — LACTATED RINGERS IV SOLN
INTRAVENOUS | Status: AC | PRN
  Administered 2019-10-06: 1000 mL

## 2019-10-06 MED ORDER — PROMETHAZINE HCL 25 MG/ML IJ SOLN: 6.2500 mg | INTRAMUSCULAR | Status: DC | PRN

## 2019-10-06 MED ORDER — CEFAZOLIN SODIUM-DEXTROSE 2-4 GM/100ML-% IV SOLN
INTRAVENOUS | Status: AC
  Filled 2019-10-06: qty 100

## 2019-10-06 MED ORDER — MIDAZOLAM HCL 2 MG/2ML IJ SOLN
INTRAMUSCULAR | Status: AC
  Filled 2019-10-06: qty 2

## 2019-10-06 MED ORDER — CHLORHEXIDINE GLUCONATE CLOTH 2 % EX PADS
6.0000 | MEDICATED_PAD | Freq: Every day | CUTANEOUS | Status: DC
  Administered 2019-10-07: 6 via TOPICAL

## 2019-10-06 MED ORDER — FENTANYL CITRATE (PF) 100 MCG/2ML IJ SOLN
INTRAMUSCULAR | Status: AC
  Filled 2019-10-06: qty 2

## 2019-10-06 MED ORDER — PHENYLEPHRINE 40 MCG/ML (10ML) SYRINGE FOR IV PUSH (FOR BLOOD PRESSURE SUPPORT)
PREFILLED_SYRINGE | INTRAVENOUS | Status: AC
  Filled 2019-10-06: qty 10

## 2019-10-06 MED ORDER — INDIGOTINDISULFONATE SODIUM 8 MG/ML IJ SOLN
INTRAMUSCULAR | Status: AC
  Filled 2019-10-06: qty 5

## 2019-10-06 MED ORDER — ONDANSETRON HCL 4 MG/2ML IJ SOLN
INTRAMUSCULAR | Status: AC
  Filled 2019-10-06: qty 2

## 2019-10-06 MED ORDER — OXYCODONE HCL 5 MG/5ML PO SOLN: 5.0000 mg | Freq: Once | ORAL | Status: DC | PRN

## 2019-10-06 MED ORDER — ATORVASTATIN CALCIUM 20 MG PO TABS
20.0000 mg | ORAL_TABLET | Freq: Every day | ORAL | Status: DC
  Administered 2019-10-06 – 2019-10-07 (×2): 20 mg via ORAL
  Filled 2019-10-06 (×2): qty 1

## 2019-10-06 MED ORDER — KETOROLAC TROMETHAMINE 15 MG/ML IJ SOLN
15.0000 mg | Freq: Four times a day (QID) | INTRAMUSCULAR | Status: DC
  Administered 2019-10-06 – 2019-10-07 (×3): 15 mg via INTRAVENOUS
  Filled 2019-10-06 (×3): qty 1

## 2019-10-06 MED ORDER — ONDANSETRON HCL 4 MG/2ML IJ SOLN: 4.0000 mg | INTRAMUSCULAR | Status: DC | PRN

## 2019-10-06 MED ORDER — MORPHINE SULFATE (PF) 2 MG/ML IV SOLN
2.0000 mg | INTRAVENOUS | Status: DC | PRN
  Administered 2019-10-06: 2 mg via INTRAVENOUS
  Filled 2019-10-06: qty 1

## 2019-10-06 MED ORDER — PROPOFOL 10 MG/ML IV BOLUS
INTRAVENOUS | Status: AC
  Filled 2019-10-06: qty 20

## 2019-10-06 MED ORDER — DOCUSATE SODIUM 100 MG PO CAPS
100.0000 mg | ORAL_CAPSULE | Freq: Two times a day (BID) | ORAL | Status: DC
  Administered 2019-10-06 – 2019-10-07 (×2): 100 mg via ORAL
  Filled 2019-10-06 (×2): qty 1

## 2019-10-06 MED ORDER — PHENYLEPHRINE 40 MCG/ML (10ML) SYRINGE FOR IV PUSH (FOR BLOOD PRESSURE SUPPORT)
PREFILLED_SYRINGE | INTRAVENOUS | Status: DC | PRN
  Administered 2019-10-06: 40 ug via INTRAVENOUS
  Administered 2019-10-06: 80 ug via INTRAVENOUS
  Administered 2019-10-06: 120 ug via INTRAVENOUS
  Administered 2019-10-06 (×2): 80 ug via INTRAVENOUS
  Administered 2019-10-06: 40 ug via INTRAVENOUS
  Administered 2019-10-06 (×3): 80 ug via INTRAVENOUS

## 2019-10-06 MED ORDER — HEPARIN SODIUM (PORCINE) 1000 UNIT/ML IJ SOLN
INTRAMUSCULAR | Status: AC
  Filled 2019-10-06: qty 1

## 2019-10-06 MED ORDER — INFLUENZA VAC SPLIT QUAD 0.5 ML IM SUSY: 0.5000 mL | PREFILLED_SYRINGE | INTRAMUSCULAR | Status: DC

## 2019-10-06 MED ORDER — SUCCINYLCHOLINE CHLORIDE 200 MG/10ML IV SOSY
PREFILLED_SYRINGE | INTRAVENOUS | Status: AC
  Filled 2019-10-06: qty 10

## 2019-10-06 MED ORDER — LOSARTAN POTASSIUM 50 MG PO TABS
100.0000 mg | ORAL_TABLET | Freq: Every day | ORAL | Status: DC
  Administered 2019-10-06 – 2019-10-07 (×2): 100 mg via ORAL
  Filled 2019-10-06 (×2): qty 2

## 2019-10-06 MED ORDER — BUPIVACAINE-EPINEPHRINE 0.25% -1:200000 IJ SOLN
INTRAMUSCULAR | Status: DC | PRN
  Administered 2019-10-06: 30 mL

## 2019-10-06 MED ORDER — BELLADONNA ALKALOIDS-OPIUM 16.2-60 MG RE SUPP: 1.0000 | Freq: Four times a day (QID) | RECTAL | Status: DC | PRN

## 2019-10-06 MED ORDER — OXYCODONE HCL 5 MG PO TABS: 5.0000 mg | ORAL_TABLET | Freq: Once | ORAL | Status: DC | PRN

## 2019-10-06 MED ORDER — SUGAMMADEX SODIUM 500 MG/5ML IV SOLN
INTRAVENOUS | Status: AC
  Filled 2019-10-06: qty 5

## 2019-10-06 MED ORDER — ORAL CARE MOUTH RINSE: 15.0000 mL | Freq: Once | OROMUCOSAL | Status: AC

## 2019-10-06 MED ORDER — LIDOCAINE 2% (20 MG/ML) 5 ML SYRINGE
INTRAMUSCULAR | Status: DC | PRN
  Administered 2019-10-06: 80 mg via INTRAVENOUS

## 2019-10-06 MED ORDER — ROCURONIUM BROMIDE 10 MG/ML (PF) SYRINGE
PREFILLED_SYRINGE | INTRAVENOUS | Status: DC | PRN
  Administered 2019-10-06 (×4): 10 mg via INTRAVENOUS
  Administered 2019-10-06: 20 mg via INTRAVENOUS
  Administered 2019-10-06: 10 mg via INTRAVENOUS
  Administered 2019-10-06: 70 mg via INTRAVENOUS

## 2019-10-06 MED ORDER — DIPHENHYDRAMINE HCL 50 MG/ML IJ SOLN: 12.5000 mg | Freq: Four times a day (QID) | INTRAMUSCULAR | Status: DC | PRN

## 2019-10-06 MED ORDER — ACETAMINOPHEN 325 MG PO TABS: 650.0000 mg | ORAL_TABLET | ORAL | Status: DC | PRN

## 2019-10-06 MED ORDER — SULFAMETHOXAZOLE-TRIMETHOPRIM 800-160 MG PO TABS: 1.0000 | ORAL_TABLET | Freq: Two times a day (BID) | ORAL | 0 refills | Status: DC

## 2019-10-06 MED ORDER — PROPOFOL 10 MG/ML IV BOLUS
INTRAVENOUS | Status: DC | PRN
  Administered 2019-10-06: 160 mg via INTRAVENOUS

## 2019-10-06 MED ORDER — CEFAZOLIN SODIUM-DEXTROSE 2-4 GM/100ML-% IV SOLN
2.0000 g | Freq: Once | INTRAVENOUS | Status: AC
  Administered 2019-10-06: 2 g via INTRAVENOUS

## 2019-10-06 MED ORDER — INSULIN ASPART 100 UNIT/ML ~~LOC~~ SOLN
0.0000 [IU] | SUBCUTANEOUS | Status: DC
  Administered 2019-10-06 (×2): 3 [IU] via SUBCUTANEOUS
  Administered 2019-10-07: 2 [IU] via SUBCUTANEOUS

## 2019-10-06 MED ORDER — MAGNESIUM CITRATE PO SOLN: 1.0000 | Freq: Once | ORAL | Status: DC

## 2019-10-06 MED ORDER — ONDANSETRON HCL 4 MG/2ML IJ SOLN
INTRAMUSCULAR | Status: DC | PRN
  Administered 2019-10-06: 4 mg via INTRAVENOUS

## 2019-10-06 MED ORDER — POTASSIUM CHLORIDE IN NACL 20-0.45 MEQ/L-% IV SOLN
INTRAVENOUS | Status: DC
  Filled 2019-10-06 (×3): qty 1000

## 2019-10-06 MED ORDER — SUGAMMADEX SODIUM 200 MG/2ML IV SOLN
INTRAVENOUS | Status: DC | PRN
  Administered 2019-10-06: 250 mg via INTRAVENOUS

## 2019-10-06 MED ORDER — FENTANYL CITRATE (PF) 100 MCG/2ML IJ SOLN
25.0000 ug | INTRAMUSCULAR | Status: DC | PRN
  Administered 2019-10-06 (×2): 25 ug via INTRAVENOUS
  Administered 2019-10-06: 50 ug via INTRAVENOUS

## 2019-10-06 MED ORDER — LACTATED RINGERS IV SOLN
INTRAVENOUS | Status: DC | PRN
  Administered 2019-10-06: 1 mL

## 2019-10-06 MED ORDER — DEXAMETHASONE SODIUM PHOSPHATE 10 MG/ML IJ SOLN
INTRAMUSCULAR | Status: DC | PRN
  Administered 2019-10-06: 10 mg via INTRAVENOUS

## 2019-10-06 MED ORDER — SODIUM CHLORIDE 0.9 % IR SOLN
Status: DC | PRN
  Administered 2019-10-06: 1000 mL

## 2019-10-06 MED ORDER — ROCURONIUM BROMIDE 10 MG/ML (PF) SYRINGE
PREFILLED_SYRINGE | INTRAVENOUS | Status: AC
  Filled 2019-10-06: qty 20

## 2019-10-06 MED ORDER — INDIGOTINDISULFONATE SODIUM 8 MG/ML IJ SOLN
INTRAMUSCULAR | Status: DC | PRN
  Administered 2019-10-06: 5 mL

## 2019-10-06 MED ORDER — ZOLPIDEM TARTRATE 5 MG PO TABS: 5.0000 mg | ORAL_TABLET | Freq: Every evening | ORAL | Status: DC | PRN

## 2019-10-06 MED ORDER — SODIUM CHLORIDE 0.9 % IV BOLUS
1000.0000 mL | Freq: Once | INTRAVENOUS | Status: AC
  Administered 2019-10-06: 1000 mL via INTRAVENOUS

## 2019-10-06 MED ORDER — AMLODIPINE BESYLATE 10 MG PO TABS
10.0000 mg | ORAL_TABLET | Freq: Every day | ORAL | Status: DC
  Administered 2019-10-06 – 2019-10-07 (×2): 10 mg via ORAL
  Filled 2019-10-06 (×2): qty 1

## 2019-10-06 MED ORDER — DIPHENHYDRAMINE HCL 12.5 MG/5ML PO ELIX: 12.5000 mg | ORAL_SOLUTION | Freq: Four times a day (QID) | ORAL | Status: DC | PRN

## 2019-10-06 MED ORDER — MIDAZOLAM HCL 5 MG/5ML IJ SOLN
INTRAMUSCULAR | Status: DC | PRN
  Administered 2019-10-06: 2 mg via INTRAVENOUS

## 2019-10-06 MED ORDER — BUPIVACAINE-EPINEPHRINE (PF) 0.25% -1:200000 IJ SOLN
INTRAMUSCULAR | Status: AC
  Filled 2019-10-06: qty 30

## 2019-10-06 MED ORDER — BACITRACIN-NEOMYCIN-POLYMYXIN 400-5-5000 EX OINT: 1.0000 "application " | TOPICAL_OINTMENT | Freq: Three times a day (TID) | CUTANEOUS | Status: DC | PRN

## 2019-10-06 SURGICAL SUPPLY — 65 items
APPLICATOR COTTON TIP 6 STRL (MISCELLANEOUS) ×4 IMPLANT
APPLICATOR COTTON TIP 6IN STRL (MISCELLANEOUS) ×8
BAG LAPAROSCOPIC 12 15 PORT 16 (BASKET) ×2 IMPLANT
BAG RETRIEVAL 12/15 (BASKET) ×3
BAG RETRIEVAL 12/15MM (BASKET) ×1
BAG URINE LEG 500ML (DRAIN) ×4 IMPLANT
CATH FOLEY 2WAY SLVR 18FR 30CC (CATHETERS) ×4 IMPLANT
CATH ROBINSON RED A/P 16FR (CATHETERS) ×4 IMPLANT
CATH ROBINSON RED A/P 8FR (CATHETERS) ×4 IMPLANT
CATH TIEMANN FOLEY 18FR 5CC (CATHETERS) ×4 IMPLANT
CHLORAPREP W/TINT 26 (MISCELLANEOUS) ×8 IMPLANT
CLIP VESOLOCK LG 6/CT PURPLE (CLIP) ×8 IMPLANT
COVER SURGICAL LIGHT HANDLE (MISCELLANEOUS) ×4 IMPLANT
COVER TIP SHEARS 8 DVNC (MISCELLANEOUS) ×2 IMPLANT
COVER TIP SHEARS 8MM DA VINCI (MISCELLANEOUS) ×3
COVER WAND RF STERILE (DRAPES) IMPLANT
CUTTER ECHEON FLEX ENDO 45 340 (ENDOMECHANICALS) ×4 IMPLANT
DECANTER SPIKE VIAL GLASS SM (MISCELLANEOUS) ×4 IMPLANT
DERMABOND ADVANCED (GAUZE/BANDAGES/DRESSINGS) ×2
DERMABOND ADVANCED .7 DNX12 (GAUZE/BANDAGES/DRESSINGS) ×2 IMPLANT
DRAIN CHANNEL RND F F (WOUND CARE) ×4 IMPLANT
DRAPE ARM DVNC X/XI (DISPOSABLE) ×8 IMPLANT
DRAPE COLUMN DVNC XI (DISPOSABLE) ×2 IMPLANT
DRAPE DA VINCI XI ARM (DISPOSABLE) ×16
DRAPE DA VINCI XI COLUMN (DISPOSABLE) ×3
DRAPE SURG IRRIG POUCH 19X23 (DRAPES) ×4 IMPLANT
DRSG TEGADERM 4X4.75 (GAUZE/BANDAGES/DRESSINGS) ×4 IMPLANT
ELECT REM PT RETURN 15FT ADLT (MISCELLANEOUS) ×4 IMPLANT
GLOVE BIO SURGEON STRL SZ 6.5 (GLOVE) ×3 IMPLANT
GLOVE BIO SURGEONS STRL SZ 6.5 (GLOVE) ×1
GLOVE BIOGEL M STRL SZ7.5 (GLOVE) ×8 IMPLANT
GOWN STRL REUS W/TWL LRG LVL3 (GOWN DISPOSABLE) ×12 IMPLANT
HOLDER FOLEY CATH W/STRAP (MISCELLANEOUS) ×4 IMPLANT
IRRIG SUCT STRYKERFLOW 2 WTIP (MISCELLANEOUS) ×4
IRRIGATION SUCT STRKRFLW 2 WTP (MISCELLANEOUS) ×2 IMPLANT
IV LACTATED RINGERS 1000ML (IV SOLUTION) ×4 IMPLANT
KIT TURNOVER KIT A (KITS) IMPLANT
NDL SAFETY ECLIPSE 18X1.5 (NEEDLE) ×2 IMPLANT
NEEDLE HYPO 18GX1.5 SHARP (NEEDLE) ×2
PACK ROBOT UROLOGY CUSTOM (CUSTOM PROCEDURE TRAY) ×4 IMPLANT
PENCIL SMOKE EVACUATOR (MISCELLANEOUS) IMPLANT
SEAL CANN UNIV 5-8 DVNC XI (MISCELLANEOUS) ×8 IMPLANT
SEAL XI 5MM-8MM UNIVERSAL (MISCELLANEOUS) ×12
SET IRRIG Y TYPE TUR BLADDER L (SET/KITS/TRAYS/PACK) ×4 IMPLANT
SET TUBE SMOKE EVAC HIGH FLOW (TUBING) ×4 IMPLANT
SOLUTION ELECTROLUBE (MISCELLANEOUS) ×4 IMPLANT
STAPLE RELOAD 45 GRN (STAPLE) ×2 IMPLANT
STAPLE RELOAD 45MM GREEN (STAPLE) ×3
SUT ETHILON 3 0 PS 1 (SUTURE) ×4 IMPLANT
SUT MNCRL 3 0 RB1 (SUTURE) ×2 IMPLANT
SUT MNCRL 3 0 VIOLET RB1 (SUTURE) ×2 IMPLANT
SUT MNCRL AB 4-0 PS2 18 (SUTURE) ×8 IMPLANT
SUT MONOCRYL 3 0 RB1 (SUTURE) ×4
SUT VIC AB 0 CT1 27 (SUTURE) ×2
SUT VIC AB 0 CT1 27XBRD ANTBC (SUTURE) ×2 IMPLANT
SUT VIC AB 0 UR5 27 (SUTURE) ×4 IMPLANT
SUT VIC AB 2-0 SH 27 (SUTURE) ×3
SUT VIC AB 2-0 SH 27X BRD (SUTURE) ×2 IMPLANT
SUT VIC AB 3-0 SH 27 (SUTURE) ×3
SUT VIC AB 3-0 SH 27XBRD (SUTURE) ×2 IMPLANT
SUT VICRYL 0 UR6 27IN ABS (SUTURE) ×8 IMPLANT
SYR 27GX1/2 1ML LL SAFETY (SYRINGE) ×4 IMPLANT
TOWEL OR NON WOVEN STRL DISP B (DISPOSABLE) ×4 IMPLANT
TROCAR XCEL NON-BLD 5MMX100MML (ENDOMECHANICALS) ×4 IMPLANT
WATER STERILE IRR 1000ML POUR (IV SOLUTION) ×4 IMPLANT

## 2019-10-06 NOTE — Anesthesia Postprocedure Evaluation (Signed)
Anesthesia Post Note  Patient: John Schaefer  Procedure(s) Performed: XI ROBOTIC ASSISTED LAPAROSCOPIC RADICAL PROSTATECTOMY LEVEL 3 (N/A Abdomen) LYMPHADENECTOMY, PELVIC (Bilateral )     Patient location during evaluation: PACU Anesthesia Type: General Level of consciousness: awake and alert Pain management: pain level controlled Vital Signs Assessment: post-procedure vital signs reviewed and stable Respiratory status: spontaneous breathing, nonlabored ventilation and respiratory function stable Cardiovascular status: blood pressure returned to baseline and stable Postop Assessment: no apparent nausea or vomiting Anesthetic complications: no   No complications documented.  Last Vitals:  Vitals:   10/06/19 1400 10/06/19 1415  BP: 136/77 (!) 141/81  Pulse: 80 81  Resp: 12 18  Temp:  36.6 C  SpO2: 93% 96%    Last Pain:  Vitals:   10/06/19 1415  TempSrc:   PainSc: [place]

## 2019-10-06 NOTE — Progress Notes (Signed)
Post-op note  Subjective: The patient is doing well.  No complaints.  Objective: Vital signs in last 24 hours: Temp:  [97.7 F (36.5 C)-98.7 F (37.1 C)] 97.7 F (36.5 C) (09/20 1433) Pulse Rate:  [78-86] 82 (09/20 1433) Resp:  [10-20] 20 (09/20 1433) BP: (126-144)/(73-85) 142/85 (09/20 1433) SpO2:  [93 %-100 %] 99 % (09/20 1433) Weight:  [109.3 kg] 109.3 kg (09/20 1607)  Intake/Output from previous day: No intake/output data recorded. Intake/Output this shift: Total I/O In: 3100 [I.V.:2000; IV Piggyback:1100] Out: 980 [Urine:200; Drains:80; Blood:700]  Physical Exam:  General: Alert and oriented. Abdomen: Soft, nondistended, appropriately tender. Incisions clean/dry/intact. LLQ JP drain with sanguinous output  GU: Foley catheter in place draining thin, cherry-colored urine without clots   Lab Results: Recent Labs    10/06/19 1305  HGB 13.9  HCT 41.5    Assessment/Plan: POD#0 s/p RALP and BPLND. Doing well.   1) Continue to monitor 2) CLD until passing flatus 3) mIVF 4) PRN analgesics and anti-emetics 5) Continue foley catheter and JP drain 6) Continue home meds  7) H/H tomorrow morning    LOS: 0 days   Celene Squibb 10/06/2019, 4:39 PM

## 2019-10-06 NOTE — Anesthesia Procedure Notes (Signed)
Procedure Name: Intubation Performed by: Jekhi Bolin J, CRNA Pre-anesthesia Checklist: Patient identified, Emergency Drugs available, Suction available, Patient being monitored and Timeout performed Patient Re-evaluated:Patient Re-evaluated prior to induction Oxygen Delivery Method: Circle system utilized Preoxygenation: Pre-oxygenation with 100% oxygen Induction Type: IV induction Ventilation: Mask ventilation without difficulty Laryngoscope Size: Mac and 4 Grade View: Grade III Tube type: Oral Tube size: 7.5 mm Number of attempts: 1 Airway Equipment and Method: Stylet Placement Confirmation: ETT inserted through vocal cords under direct vision,  positive ETCO2 and breath sounds checked- equal and bilateral Secured at: 23 cm Tube secured with: Tape Dental Injury: Teeth and Oropharynx as per pre-operative assessment        

## 2019-10-06 NOTE — Transfer of Care (Signed)
Immediate Anesthesia Transfer of Care Note  Patient: John Schaefer  Procedure(s) Performed: XI ROBOTIC ASSISTED LAPAROSCOPIC RADICAL PROSTATECTOMY LEVEL 3 (N/A Abdomen) LYMPHADENECTOMY, PELVIC (Bilateral )  Patient Location: PACU  Anesthesia Type:General  Level of Consciousness: sedated, patient cooperative and responds to stimulation  Airway & Oxygen Therapy: Patient Spontanous Breathing and Patient connected to face mask oxygen  Post-op Assessment: Report given to RN and Post -op Vital signs reviewed and stable  Post vital signs: Reviewed and stable  Last Vitals:  Vitals Value Taken Time  BP 137/79 10/06/19 1248  Temp    Pulse 82 10/06/19 1250  Resp 22 10/06/19 1250  SpO2 98 % 10/06/19 1250  Vitals shown include unvalidated device data.  Last Pain:  Vitals:   10/06/19 0626  TempSrc: Oral         Complications: No complications documented.

## 2019-10-06 NOTE — Discharge Instructions (Signed)

## 2019-10-06 NOTE — Plan of Care (Signed)
  Problem: Activity: Goal: Risk for activity intolerance will decrease Outcome: Progressing   Problem: Nutrition: Goal: Adequate nutrition will be maintained Outcome: Progressing   Problem: Elimination: Goal: Will not experience complications related to bowel motility Outcome: Progressing   Problem: Pain Managment: Goal: General experience of comfort will improve Outcome: Progressing   Problem: Education: Goal: Knowledge of the procedure and recovery process will improve Outcome: Progressing   Problem: Education: Goal: Knowledge of General Education information will improve Description: Including pain rating scale, medication(s)/side effects and non-pharmacologic comfort measures Outcome: Completed/Met

## 2019-10-06 NOTE — Op Note (Addendum)
Preoperative diagnosis: Clinically localized adenocarcinoma of the prostate (clinical stage T1c Nx Mx)  Postoperative diagnosis: Clinically localized adenocarcinoma of the prostate (clinical stage T1c Nx Mx)  Procedure:  1. Robotic assisted laparoscopic radical prostatectomy (bilateral nerve sparing) 2. Bilateral robotic assisted laparoscopic pelvic lymphadenectomy  Surgeon: Pryor Curia. M.D.  Assistant: Debbrah Alar, PA-C  An assistant was required for this surgical procedure.  The duties of the assistant included but were not limited to suctioning, passing suture, camera manipulation, retraction. This procedure would not be able to be performed without an Environmental consultant.  Resident: Dr. Celene Squibb  Anesthesia: General  Complications: None  EBL: 700 mL  IVF:  2000 mL crystalloid  Specimens: 1. Prostate and seminal vesicles 2. Right pelvic lymph nodes 3. Left pelvic lymph nodes  Disposition of specimens: Pathology  Drains: 1. 20 Fr coude catheter 2. # 19 Blake pelvic drain  Indication: John Schaefer is a 64 y.o. year old patient with clinically localized prostate cancer.  After a thorough review of the management options for treatment of prostate cancer, he elected to proceed with surgical therapy and the above procedure(s).  We have discussed the potential benefits and risks of the procedure, side effects of the proposed treatment, the likelihood of the patient achieving the goals of the procedure, and any potential problems that might occur during the procedure or recuperation. Informed consent has been obtained.  Description of procedure:  The patient was taken to the operating room and a general anesthetic was administered. He was given preoperative antibiotics, placed in the dorsal lithotomy position, and prepped and draped in the usual sterile fashion. Next a preoperative timeout was performed. A urethral catheter was placed into the bladder and a site was  selected near the umbilicus for placement of the camera port. This was placed using a standard open Hassan technique which allowed entry into the peritoneal cavity under direct vision and without difficulty. An 8 mm robotic port was placed and a pneumoperitoneum established. The camera was then used to inspect the abdomen and there was no evidence of any intra-abdominal injuries or other abnormalities. The remaining abdominal ports were then placed. 8 mm robotic ports were placed in the right lower quadrant, left lower quadrant, and far left lateral abdominal wall. A 5 mm port was placed in the right upper quadrant and a 12 mm port was placed in the right lateral abdominal wall for laparoscopic assistance. All ports were placed under direct vision without difficulty. The surgical cart was then docked.   Utilizing the cautery scissors, the bladder was reflected posteriorly allowing entry into the space of Retzius and identification of the endopelvic fascia and prostate. The periprostatic fat was then removed from the prostate allowing full exposure of the endopelvic fascia.  The prostate was noted to be quite large consistent with the 260 cc gland measured preoperatively. The endopelvic fascia was then incised from the apex back to the base of the prostate bilaterally and the underlying levator muscle fibers were swept laterally off the prostate thereby isolating the dorsal venous complex. The dorsal vein was then stapled and divided with a 45 mm Flex Echelon stapler. Attention then turned to the bladder neck which was divided anteriorly thereby allowing entry into the bladder and exposure of the urethral catheter. The catheter balloon was deflated and the catheter was brought into the operative field and used to retract the prostate anteriorly. The posterior bladder neck was then examined and was divided allowing further dissection between the  bladder and prostate posteriorly until the vasa deferentia and seminal  vessels were identified. The vasa deferentia were isolated, divided, and lifted anteriorly. The seminal vesicles were dissected down to their tips with care to control the seminal vascular arterial blood supply. These structures were then lifted anteriorly and the space between Denonvillier's fascia and the anterior rectum was developed with a combination of sharp and blunt dissection. This isolated the vascular pedicles of the prostate.  The lateral prostatic fascia was then sharply incised allowing release of the neurovascular bundles bilaterally. The vascular pedicles of the prostate were then ligated with Weck clips between the prostate and neurovascular bundles and divided with sharp cold scissor dissection resulting in neurovascular bundle preservation. The neurovascular bundles were then separated off the apex of the prostate and urethra bilaterally.  This dissection was quite tedious due to the enormous size of the prostate but was without complications.  The urethra was then sharply transected allowing the prostate specimen to be disarticulated. The pelvis was copiously irrigated and hemostasis was ensured. There was no evidence for rectal injury.  Attention then turned to the right pelvic sidewall. The fibrofatty tissue between the external iliac vein, confluence of the iliac vessels, hypogastric artery, and Cooper's ligament was dissected free from the pelvic sidewall with care to preserve the obturator nerve. Weck clips were used for lymphostasis and hemostasis. An identical procedure was performed on the contralateral side and the lymphatic packets were removed for permanent pathologic analysis.  Attention then turned to the urethral anastomosis. A 2-0 Vicryl slip knot was placed between Denonvillier's fascia, the posterior bladder neck, and the posterior urethra to reapproximate these structures. A double-armed 3-0 Monocryl suture was then used to perform a 360 running tension-free anastomosis  between the bladder neck and urethra. A new urethral catheter was then placed into the bladder and irrigated. There were no blood clots within the bladder and the anastomosis appeared to be watertight. A #19 Blake drain was then brought through the left lateral 8 mm port site and positioned appropriately within the pelvis. It was secured to the skin with a nylon suture. The surgical cart was then undocked. The right lateral 12 mm port site was closed at the fascial level with a 0 Vicryl suture placed laparoscopically. All remaining ports were then removed under direct vision. The prostate specimen was removed intact within the Endopouch retrieval bag via the periumbilical camera port site. This fascial opening was closed with two running 0 Vicryl sutures. 0.25% Marcaine was then injected into all port sites and all incisions were reapproximated at the skin level with 4-0 Monocryl subcuticular sutures and Dermabond. The patient appeared to tolerate the procedure well and without complications. The patient was able to be extubated and transferred to the recovery unit in satisfactory condition.   Pryor Curia MD

## 2019-10-07 ENCOUNTER — Encounter (HOSPITAL_COMMUNITY): Payer: Self-pay | Admitting: Urology

## 2019-10-07 DIAGNOSIS — C61 Malignant neoplasm of prostate: Secondary | ICD-10-CM | POA: Diagnosis not present

## 2019-10-07 LAB — GLUCOSE, CAPILLARY
Glucose-Capillary: 105 mg/dL — ABNORMAL HIGH (ref 70–99)
Glucose-Capillary: 117 mg/dL — ABNORMAL HIGH (ref 70–99)
Glucose-Capillary: 130 mg/dL — ABNORMAL HIGH (ref 70–99)
Glucose-Capillary: 91 mg/dL (ref 70–99)

## 2019-10-07 LAB — HEMOGLOBIN AND HEMATOCRIT, BLOOD
HCT: 34.9 % — ABNORMAL LOW (ref 39.0–52.0)
Hemoglobin: 11.9 g/dL — ABNORMAL LOW (ref 13.0–17.0)

## 2019-10-07 MED ORDER — BISACODYL 10 MG RE SUPP
10.0000 mg | Freq: Once | RECTAL | Status: AC
  Administered 2019-10-07: 10 mg via RECTAL
  Filled 2019-10-07: qty 1

## 2019-10-07 MED ORDER — TRAMADOL HCL 50 MG PO TABS: 50.0000 mg | ORAL_TABLET | Freq: Four times a day (QID) | ORAL | Status: DC | PRN

## 2019-10-07 NOTE — Progress Notes (Signed)
Patient ID: John Schaefer, male   DOB: [birth year], 64 y.o.   MRN: [id number]  1 Day Post-Op Subjective: The patient is doing well.  Only ambulated once last night. No nausea or vomiting. Pain is adequately controlled.  Objective: Vital signs in last 24 hours: Temp:  [97.7 F (36.5 C)-98.7 F (37.1 C)] 98.6 F (37 C) (09/21 0546) Pulse Rate:  [76-89] 76 (09/21 0546) Resp:  [10-20] 16 (09/21 0546) BP: (107-154)/(62-85) 107/62 (09/21 0546) SpO2:  [93 %-100 %] 96 % (09/21 0546) Weight:  [109.3 kg] 109.3 kg (09/20 1607)  Intake/Output from previous day: 09/20 0701 - 09/21 0700 In: 3895.3 [P.O.:360; I.V.:2385.3; IV Piggyback:1150] Out: 3060 [Urine:2200; Drains:160; Blood:700] Intake/Output this shift: No intake/output data recorded.  Physical Exam:  General: Alert and oriented. CV: RRR Lungs: Clear bilaterally. GI: Soft, Nondistended. Incisions: Clean, dry, and intact Urine: Clear Extremities: Nontender, no erythema, no edema.  Lab Results: Recent Labs    10/06/19 1305 10/07/19 0458  HGB 13.9 11.9*  HCT 41.5 34.9*      Assessment/Plan: POD# 1 s/p robotic prostatectomy.  1) SL IVF 2) Ambulate, Incentive spirometry 3) Transition to oral pain medication 4) Dulcolax suppository 5) D/C pelvic drain 6) Plan for likely discharge later today   Pryor Curia. MD   LOS: 0 days   John Schaefer 10/07/2019, 7:17 AM

## 2019-10-07 NOTE — Discharge Summary (Signed)
Date of admission: 10/06/2019  Date of discharge: 10/07/2019  Admission diagnosis: Prostate Cancer  Discharge diagnosis: Prostate Cancer  History and Physical: For full details, please see admission history and physical. Briefly, John Schaefer is a 64 y.o. gentleman with localized prostate cancer.  After discussing management/treatment options, he elected to proceed with surgical treatment.  Hospital Course: Melvin Whiteford Beber was taken to the operating room on 10/06/2019 and underwent a robotic assisted laparoscopic radical prostatectomy. He tolerated this procedure well and without complications. Postoperatively, he was able to be transferred to a regular hospital room following recovery from anesthesia.  He was able to begin ambulating the night of surgery. He remained hemodynamically stable overnight.  He had excellent urine output with appropriately minimal output from his pelvic drain and his pelvic drain was removed on POD #1.  He was transitioned to oral pain medication, tolerated a clear liquid diet, and had met all discharge criteria and was able to be discharged home later on POD#1.  Laboratory values: Recent Labs    10/06/19 1305 10/07/19 0458  HGB 13.9 11.9*  HCT 41.5 34.9*    Disposition: Home  Discharge instruction: He was instructed to be ambulatory but to refrain from heavy lifting, strenuous activity, or driving. He was instructed on urethral catheter care.  Discharge medications:   Allergies as of 10/07/2019   No Known Allergies     Medication List    STOP taking these medications   amoxicillin-clavulanate 500-125 MG tablet Commonly known as: AUGMENTIN   b complex vitamins tablet   cephALEXin 500 MG capsule Commonly known as: KEFLEX   finasteride 5 MG tablet Commonly known as: PROSCAR   Garlic 2637 MG Caps   multivitamin with minerals Tabs tablet   Omega-3 1000 MG Caps   sildenafil 20 MG tablet Commonly known as: REVATIO   tamsulosin 0.4 MG Caps  capsule Commonly known as: FLOMAX   vitamin C 1000 MG tablet   vitamin E 180 MG (400 UNITS) capsule     TAKE these medications   allopurinol 300 MG tablet Commonly known as: ZYLOPRIM TAKE 1 TABLET BY MOUTH ONCE DAILY   amLODipine 10 MG tablet Commonly known as: NORVASC TAKE 1 TABLET BY MOUTH ONCE DAILY   amLODipine 10 MG tablet Commonly known as: NORVASC Take 1 tablet (10 mg total) by mouth daily.   atorvastatin 20 MG tablet Commonly known as: LIPITOR TAKE 1 TABLET BY MOUTH ONCE DAILY   atorvastatin 20 MG tablet Commonly known as: LIPITOR Take 1 tablet (20 mg total) by mouth daily.   hydrochlorothiazide 25 MG tablet Commonly known as: HYDRODIURIL Take 25 mg by mouth daily.   losartan 100 MG tablet Commonly known as: COZAAR Take 100 mg by mouth daily.   losartan-hydrochlorothiazide 100-25 MG tablet Commonly known as: HYZAAR TAKE 1 TABLET BY MOUTH ONCE DAILY   losartan-hydrochlorothiazide 100-25 MG tablet Commonly known as: HYZAAR Take 1 tablet by mouth daily.   metFORMIN 1000 MG tablet Commonly known as: GLUCOPHAGE Take 1,000 mg by mouth 2 (two) times daily with a meal.   metFORMIN 500 MG tablet Commonly known as: GLUCOPHAGE TAKE 1 TABLET BY MOUTH ONCE DAILY WITH  BREAKFAST.  (OFFICE  VISIT  NEEDED  FOR  LABS  FOR  REFILLS)   metFORMIN 500 MG tablet Commonly known as: GLUCOPHAGE Take 1 tablet (500 mg total) by mouth 2 (two) times daily with a meal.   sulfamethoxazole-trimethoprim 800-160 MG tablet Commonly known as: BACTRIM DS Take 1 tablet by mouth  2 (two) times daily. Start the day prior to foley removal appointment   traMADol 50 MG tablet Commonly known as: Ultram Take 1-2 tablets (50-100 mg total) by mouth every 6 (six) hours as needed for moderate pain or severe pain.       Followup: He will followup in 1 week for catheter removal and to discuss his surgical pathology results.

## 2019-10-16 LAB — SURGICAL PATHOLOGY

## 2019-11-13 ENCOUNTER — Telehealth: Payer: Self-pay

## 2019-11-13 NOTE — Telephone Encounter (Signed)
NOTES ON FILE FROM [facility] [phone] SENT REFERRAL TO SCHEDULING

## 2019-11-28 ENCOUNTER — Other Ambulatory Visit (HOSPITAL_COMMUNITY): Payer: Self-pay | Admitting: Family Medicine

## 2019-11-28 DIAGNOSIS — R011 Cardiac murmur, unspecified: Secondary | ICD-10-CM

## 2019-12-26 ENCOUNTER — Ambulatory Visit (HOSPITAL_COMMUNITY): Payer: 59 | Attending: Cardiology

## 2019-12-26 ENCOUNTER — Other Ambulatory Visit: Payer: Self-pay

## 2019-12-26 DIAGNOSIS — R011 Cardiac murmur, unspecified: Secondary | ICD-10-CM | POA: Insufficient documentation

## 2019-12-26 LAB — ECHOCARDIOGRAM COMPLETE
AR max vel: 1.66 cm2
AV Area VTI: 2.05 cm2
AV Area mean vel: 1.71 cm2
AV Mean grad: 8 mmHg
AV Peak grad: 18.7 mmHg
Ao pk vel: 2.16 m/s
Area-P 1/2: 2.82 cm2
S' Lateral: 1.6 cm

## 2019-12-26 MED ORDER — PERFLUTREN LIPID MICROSPHERE
1.0000 mL | INTRAVENOUS | Status: AC | PRN
  Administered 2019-12-26: 2 mL via INTRAVENOUS

## 2020-03-16 ENCOUNTER — Other Ambulatory Visit (HOSPITAL_COMMUNITY): Payer: Self-pay | Admitting: Urology

## 2020-03-16 DIAGNOSIS — C61 Malignant neoplasm of prostate: Secondary | ICD-10-CM

## 2020-03-30 ENCOUNTER — Other Ambulatory Visit: Payer: Self-pay

## 2020-03-30 ENCOUNTER — Ambulatory Visit (HOSPITAL_COMMUNITY)
Admission: RE | Admit: 2020-03-30 | Discharge: 2020-03-30 | Disposition: A | Discharge status: Home / Self Care | Payer: 59 | Source: Ambulatory Visit | Attending: Urology | Admitting: Urology

## 2020-03-30 DIAGNOSIS — C61 Malignant neoplasm of prostate: Secondary | ICD-10-CM | POA: Diagnosis not present

## 2020-03-30 MED ORDER — PIFLIFOLASTAT F 18 (PYLARIFY) INJECTION
9.0000 | Freq: Once | INTRAVENOUS | Status: AC
  Administered 2020-03-30: 9 via INTRAVENOUS

## 2020-08-05 ENCOUNTER — Telehealth: Payer: Self-pay | Admitting: Radiation Oncology

## 2020-08-24 ENCOUNTER — Ambulatory Visit: Payer: 59 | Admitting: Radiation Oncology

## 2020-08-24 ENCOUNTER — Ambulatory Visit: Payer: 59

## 2020-08-27 NOTE — Progress Notes (Signed)
GU Location of Tumor / Histology:  Metastatic prostate cancer  If Prostate Cancer, Gleason Score is (4 + 3), PSA (<0.015 as of 07/28/2020; currently undetectable following ADT), and Prostate volume (204 g)  John Schaefer presented with signs/symptoms of: (08/04/2020 office note from Dr. Raynelle Bring)  Location(s) of Symptomatic tumor(s):  03/30/2020 PET Scan --IMPRESSION: 1. Single radiotracer avid lymph node in the presacral space anterior to the S1 vertebral body is concerning for prostate cancer nodal metastasis.  Biopsies revealed:  10/06/2019 FINAL MICROSCOPIC DIAGNOSIS:  A. LYMPH NODES, RIGHT PELVIC, RESECTION:  - No carcinoma identified in three lymph nodes (0/3)  B.  LYMPH NODES, LEFT PELVIC, RESECTION:  - No carcinoma identified in five lymph nodes (0/5)  C. PROSTATE, RADICAL PROSTATECTOMY:  - Prostatic acinar adenocarcinoma, Grade group 3 (4+3 =7)  - Extraprostatic extension present  - Margins uninvolved by adenocarcinoma   04/01/2019   Past/Anticipated interventions by urology, if any:  10/06/2019 Dr. Raynelle Bring Robotic assisted laparoscopic radical prostatectomy (bilateral nerve sparing) Bilateral robotic assisted laparoscopic pelvic lymphadenectomy  Past/Anticipated interventions by medical oncology, if any:  No referral placed at this time  Weight changes, if any: no  IPSS Score: 8 SHIM Score:18  Bowel/Bladder complaints, if any: no   Nausea/Vomiting, if any: no  Pain issues, if any:  no  SAFETY ISSUES: Prior radiation? no Pacemaker/ICD? no Possible current pregnancy? N/A Is the patient on methotrexate? no  Current Complaints / other details:  patient has many questions about treatment options no other issues at this time.

## 2020-08-31 ENCOUNTER — Ambulatory Visit
Admission: RE | Admit: 2020-08-31 | Discharge: 2020-08-31 | Disposition: A | Discharge status: Home / Self Care | Payer: 59 | Source: Ambulatory Visit | Attending: Radiation Oncology | Admitting: Radiation Oncology

## 2020-08-31 ENCOUNTER — Other Ambulatory Visit: Payer: Self-pay

## 2020-08-31 ENCOUNTER — Encounter: Payer: Self-pay | Admitting: Radiation Oncology

## 2020-08-31 ENCOUNTER — Institutional Professional Consult (permissible substitution): Payer: 59 | Admitting: Radiation Oncology

## 2020-08-31 ENCOUNTER — Ambulatory Visit: Payer: 59

## 2020-08-31 VITALS — BP 137/80 | HR 78 | Temp 97.0°F | Resp 18 | Ht 70.0 in | Wt 235.4 lb

## 2020-08-31 DIAGNOSIS — I10 Essential (primary) hypertension: Secondary | ICD-10-CM | POA: Insufficient documentation

## 2020-08-31 DIAGNOSIS — Z7984 Long term (current) use of oral hypoglycemic drugs: Secondary | ICD-10-CM | POA: Insufficient documentation

## 2020-08-31 DIAGNOSIS — C775 Secondary and unspecified malignant neoplasm of intrapelvic lymph nodes: Secondary | ICD-10-CM | POA: Diagnosis not present

## 2020-08-31 DIAGNOSIS — C61 Malignant neoplasm of prostate: Secondary | ICD-10-CM | POA: Insufficient documentation

## 2020-08-31 DIAGNOSIS — Z8673 Personal history of transient ischemic attack (TIA), and cerebral infarction without residual deficits: Secondary | ICD-10-CM | POA: Insufficient documentation

## 2020-08-31 DIAGNOSIS — M129 Arthropathy, unspecified: Secondary | ICD-10-CM | POA: Insufficient documentation

## 2020-08-31 DIAGNOSIS — Z79899 Other long term (current) drug therapy: Secondary | ICD-10-CM | POA: Diagnosis not present

## 2020-08-31 DIAGNOSIS — N529 Male erectile dysfunction, unspecified: Secondary | ICD-10-CM | POA: Insufficient documentation

## 2020-08-31 NOTE — Progress Notes (Signed)
Radiation Oncology         [phone]) [phone] ________________________________  Initial Outpatient Consultation  Name: John Schaefer MRN: [id number]  Date: 08/31/2020  DOB: 18-Jun-1955  [id number], Elwin Sleight, MD  Raynelle Bring, MD   REFERRING PHYSICIAN: Raynelle Bring, MD  DIAGNOSIS: 65 y.o. 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    ICD-10-CM   1. Prostate cancer ([place])  C61     2. Malignant neoplasm of prostate metastatic to intrapelvic lymph node ([facility])  C61    C77.5       HISTORY OF PRESENT ILLNESS: John Schaefer is a 65 y.o. male with a diagnosis of oligometastatic prostate cancer. He was initially referred to Dr. Karsten Ro for an elevated PSA of 31 back in 10/2016. Biopsy performed in 11/2016 showed one area of atypia. His PSA continued to rise and reached 41.9 in 01/2019. Surveillance prostate MRI on 03/10/19 showed a PI-RADS category 4 area along the right base. He was subsequently diagnosed with Gleason 3+4 prostate cancer on biopsy on 04/01/19. He opted to proceed with RALP on 10/06/19 under the care of Dr. Alinda Money. Final surgical pathology revealed Gleason 4+3 prostatic adenocarcinoma with extra prostatic extension present, but negative margins, and no evidence of seminal vesicle or lymph node involvement (0/8). His postoperative PSA was 0.76 12/30/19 and stable detectable at 0.73 on 01/21/20.   This prompted PSMA scan on 03/30/20 showing a single radiotracer-avid lymph node in the presacral space anterior to the S1 vertebral body, concerning for prostate cancer nodal metastasis but no retroperitoneal, periaortic nodal, visceral, or skeletal metastasis and no evidence of local recurrence of prostatectomy bed.  He was started on Eligard ADT (6 month injection) 04/12/20 and a follow up PSA on 07/28/20 was undetectable.  He has now regained good bladder  continence and is ready to proceed with salvage radiotherapy.  The patient reviewed the PSA and surgical pathology results with his urologist and he has kindly been referred today for discussion of potential radiation treatment options.   PREVIOUS RADIATION THERAPY: No  PAST MEDICAL HISTORY:  Past Medical History:  Diagnosis Date   Arthritis    Cancer ([place])    prostate   Hypertension    Stroke [facility])       PAST SURGICAL HISTORY: Past Surgical History:  Procedure Laterality Date   LYMPHADENECTOMY Bilateral 10/06/2019   Procedure: LYMPHADENECTOMY, PELVIC;  Surgeon: Raynelle Bring, MD;  Location: [facility];  Service: Urology;  Laterality: Bilateral;   NO PAST SURGERIES     ROBOT ASSISTED LAPAROSCOPIC RADICAL PROSTATECTOMY N/A 10/06/2019   Procedure: XI ROBOTIC ASSISTED LAPAROSCOPIC RADICAL PROSTATECTOMY LEVEL 3;  Surgeon: Raynelle Bring, MD;  Location: [facility];  Service: Urology;  Laterality: N/A;    FAMILY HISTORY:  Family History  Problem Relation Age of Onset   Heart disease Father 11       MI   Diabetes Brother    Stroke Brother    Heart disease Paternal Aunt     SOCIAL HISTORY:  Social History   Socioeconomic History   Marital status: Married    Spouse name: Not on file   Number of children: Not on file   Years of education: Not on file   Highest education level: Not on file  Occupational History   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  Tobacco Use   Smoking  status: Former    Packs/day: 0.50    Years: 11.00    Pack years: 5.50    Types: Cigarettes   Smokeless tobacco: Never  Vaping Use   Vaping Use: Never used  Substance and Sexual Activity   Alcohol use: No    Alcohol/week: 0.0 standard drinks   Drug use: No   Sexual activity: Yes    Comment: 2 sexual partners in last 12 months  Other Topics Concern   Not on file  Social History Narrative   Not on file   Social Determinants of Health   Financial Resource Strain: Not on file  Food  Insecurity: Not on file  Transportation Needs: Not on file  Physical Activity: Not on file  Stress: Not on file  Social Connections: Not on file  Intimate Partner Violence: Not on file    ALLERGIES: Patient has no known allergies.  MEDICATIONS:  Current Outpatient Medications  Medication Sig Dispense Refill   amLODipine (NORVASC) 10 MG tablet Take 1 tablet (10 mg total) by mouth daily. 90 tablet 3   atorvastatin (LIPITOR) 20 MG tablet TAKE 1 TABLET BY MOUTH ONCE DAILY (Patient taking differently: Take 20 mg by mouth daily.) 30 tablet 0   hydrochlorothiazide (HYDRODIURIL) 25 MG tablet Take 25 mg by mouth daily.     losartan (COZAAR) 100 MG tablet Take 100 mg by mouth daily.     metFORMIN (GLUCOPHAGE) 1000 MG tablet Take 1,000 mg by mouth 2 (two) times daily with a meal.     sildenafil (REVATIO) 20 MG tablet Take 40 mg by mouth daily.     traMADol (ULTRAM) 50 MG tablet Take 1-2 tablets (50-100 mg total) by mouth every 6 (six) hours as needed for moderate pain or severe pain. (Patient not taking: Reported on 08/31/2020) 20 tablet 0   No current facility-administered medications for this encounter.    REVIEW OF SYSTEMS:  On review of systems, the patient reports that he is doing well overall. He denies any chest pain, shortness of breath, cough, fevers, chills, night sweats, unintended weight changes. He denies any bowel disturbances, and denies abdominal pain, nausea or vomiting. He denies any new musculoskeletal or joint aches or pains. His IPSS was 8, indicating mild urinary symptoms. His SHIM was 18, indicating he has mild-moderate postoperative erectile dysfunction. A complete review of systems is obtained and is otherwise negative.    PHYSICAL EXAM:  Wt Readings from Last 3 Encounters:  08/31/20 235 lb 6 oz (106.8 kg)  10/06/19 241 lb (109.3 kg)  09/30/19 241 lb (109.3 kg)   Temp Readings from Last 3 Encounters:  08/31/20 (!) 97 F (36.1 C) (Temporal)  10/07/19 98.2 F (36.8 C)  (Oral)  09/30/19 98.6 F (37 C) (Oral)   BP Readings from Last 3 Encounters:  08/31/20 137/80  10/07/19 117/72  09/30/19 128/72   Pulse Readings from Last 3 Encounters:  08/31/20 78  10/07/19 81  09/30/19 96   Pain Assessment Pain Score: 0-No pain/10  In general this is a well appearing African American male in no acute distress. He's alert and oriented x4 and appropriate throughout the examination. Cardiopulmonary assessment is negative for acute distress, and he exhibits normal effort.     KPS = 100  100 - Normal; no complaints; no evidence of disease. 90   - Able to carry on normal activity; minor signs or symptoms of disease. 80   - Normal activity with effort; some signs or symptoms of disease. 70   -  Cares for self; unable to carry on normal activity or to do active work. 60   - Requires occasional assistance, but is able to care for most of his personal needs. 50   - Requires considerable assistance and frequent medical care. 76   - Disabled; requires special care and assistance. 23   - Severely disabled; hospital admission is indicated although death not imminent. 19   - Very sick; hospital admission necessary; active supportive treatment necessary. 10   - Moribund; fatal processes progressing rapidly. 0     - Dead  Karnofsky DA, Abelmann Harper, Craver LS and Burchenal [facility] [phone]) The use of the nitrogen mustards in the palliative treatment of carcinoma: with particular reference to bronchogenic carcinoma Cancer 1 [phone]  LABORATORY DATA:  Lab Results  Component Value Date   WBC 10.5 09/30/2019   HGB 11.9 (L) 10/07/2019   HCT 34.9 (L) 10/07/2019   MCV 86.2 09/30/2019   PLT 280 09/30/2019   Lab Results  Component Value Date   NA 141 09/30/2019   K 3.7 09/30/2019   CL 98 09/30/2019   CO2 29 09/30/2019   Lab Results  Component Value Date   ALT 39 05/16/2016   AST 23 05/16/2016   ALKPHOS 95 05/16/2016   BILITOT 1.4 (H) 05/16/2016     RADIOGRAPHY: No results  found.    IMPRESSION/PLAN: 1. 65 y.o. 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  Today, we reviewed the findings and workup thus far.  We discussed the natural history of prostate cancer.  We reviewed the the implications of positive margins, extracapsular extension, and seminal vesicle involvement on the risk of prostate cancer recurrence. In his case, extraprostatic extension was present and his post-operative PSA was not zero.  There is also evidence of oligometastatic disease involving a solitary presacral lymph node anterior to S1 on his recent PSMA PET scan.  We reviewed some of the evidence suggesting an advantage for patients who undergo salvage radiotherapy in the setting in terms of disease control and overall survival. We discussed radiation treatment directed to the prostatic fossa and pelvic lymph nodes with regard to the logistics and delivery of external beam radiation treatment.  At the conclusion of our conversation, the patient is interested in moving forward with a 7.5 week course of daily salvage external beam therapy to the prostate fossa and pelvic nodes. The patient appears to have a good understanding of his disease and our treatment recommendations which are of curative intent and he is in agreement with the stated plan.  He has freely signed written consent to proceed today in the office and a copy of this document will be placed in his chart.  He is tentatively scheduled for CT SIM/treatment planning at 2:30p on Friday 09/03/20, in anticipation of beginning his daily treatments in the near future. Therefore, we will share our discussion with Dr. Alinda Money and move forward with treatment planning accordingly, in anticipation of beginning IMRT in the near future.  I personally spent 70 minutes in this encounter including chart  review, reviewing radiological studies, meeting face-to-face with the patient, entering orders and completing documentation.   Nicholos Johns, PA-C    Tyler Pita, MD  [facility] Direct Dial: [phone]  Fax: [phone] [email or web address]  Skype  [email or web address]   This document serves as a record of services personally  performed by Tyler Pita, MD and Freeman Caldron, PA-C. It was created on their behalf by Wilburn Mylar, a trained medical scribe. The creation of this record is based on the scribe's personal observations and the provider's statements to them. This document has been checked and approved by the attending provider.

## 2020-09-03 ENCOUNTER — Ambulatory Visit
Admission: RE | Admit: 2020-09-03 | Discharge: 2020-09-03 | Disposition: A | Discharge status: Home / Self Care | Payer: 59 | Source: Ambulatory Visit | Attending: Radiation Oncology | Admitting: Radiation Oncology

## 2020-09-03 DIAGNOSIS — Z510 Encounter for antineoplastic radiation therapy: Secondary | ICD-10-CM | POA: Diagnosis not present

## 2020-09-03 DIAGNOSIS — C775 Secondary and unspecified malignant neoplasm of intrapelvic lymph nodes: Secondary | ICD-10-CM | POA: Insufficient documentation

## 2020-09-03 DIAGNOSIS — C61 Malignant neoplasm of prostate: Secondary | ICD-10-CM | POA: Insufficient documentation

## 2020-09-08 NOTE — Progress Notes (Signed)
  Radiation Oncology         [phone] ________________________________  Name: John Schaefer [place] MRN: [id number]  Date: 09/03/2020  DOB: January 23, 1955  SIMULATION AND TREATMENT PLANNING NOTE    ICD-10-CM   1. Prostate cancer ([place])  C61     2. Malignant neoplasm of prostate metastatic to intrapelvic lymph node ([place])  C61    C77.5       DIAGNOSIS:  65 y.o. 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  NARRATIVE:  The patient was brought to the [facility].  Identity was confirmed.  All relevant records and images related to the planned course of therapy were reviewed.  The patient freely provided informed written consent to proceed with treatment after reviewing the details related to the planned course of therapy. The consent form was witnessed and verified by the simulation staff.  Then, the patient was set-up in a stable reproducible supine position for radiation therapy.  A vacuum lock pillow device was custom fabricated to position his legs in a reproducible immobilized position.  Then, I performed a urethrogram under sterile conditions to identify the prostatic bed.  CT images were obtained.  Surface markings were placed.  The CT images were loaded into the planning software.  Then the prostate bed target, pelvic lymph node target and avoidance structures including the rectum, bladder, bowel and hips were contoured.  Treatment planning then occurred.  The radiation prescription was entered and confirmed.  A total of one complex treatment devices were fabricated. I have requested : Intensity Modulated Radiotherapy (IMRT) is medically necessary for this case for the following reason:  Rectal sparing.Marland Kitchen  PLAN:  The patient will receive 45 Gy in 25 fractions of 1.8 Gy, followed by a boost to the prostate bed to a total dose of 68.4 Gy  with 13 additional fractions of 1.8 Gy with simultaneous boost to the prescacral node to 71 Gy in 13 fractions of 2 Gy   ________________________________  Sheral Apley Tammi Klippel, M.D.

## 2020-09-13 DIAGNOSIS — Z510 Encounter for antineoplastic radiation therapy: Secondary | ICD-10-CM | POA: Diagnosis not present

## 2020-09-14 ENCOUNTER — Other Ambulatory Visit: Payer: Self-pay

## 2020-09-14 ENCOUNTER — Ambulatory Visit
Admission: RE | Admit: 2020-09-14 | Discharge: 2020-09-14 | Disposition: A | Discharge status: Home / Self Care | Payer: 59 | Source: Ambulatory Visit | Attending: Radiation Oncology | Admitting: Radiation Oncology

## 2020-09-14 DIAGNOSIS — Z510 Encounter for antineoplastic radiation therapy: Secondary | ICD-10-CM | POA: Diagnosis not present

## 2020-09-15 ENCOUNTER — Ambulatory Visit
Admission: RE | Admit: 2020-09-15 | Discharge: 2020-09-15 | Disposition: A | Discharge status: Home / Self Care | Payer: 59 | Source: Ambulatory Visit | Attending: Radiation Oncology | Admitting: Radiation Oncology

## 2020-09-15 DIAGNOSIS — Z510 Encounter for antineoplastic radiation therapy: Secondary | ICD-10-CM | POA: Diagnosis not present

## 2020-09-16 ENCOUNTER — Ambulatory Visit
Admission: RE | Admit: 2020-09-16 | Discharge: 2020-09-16 | Disposition: A | Discharge status: Home / Self Care | Payer: 59 | Source: Ambulatory Visit | Attending: Radiation Oncology | Admitting: Radiation Oncology

## 2020-09-16 ENCOUNTER — Other Ambulatory Visit: Payer: Self-pay

## 2020-09-16 DIAGNOSIS — C61 Malignant neoplasm of prostate: Secondary | ICD-10-CM | POA: Diagnosis present

## 2020-09-16 DIAGNOSIS — Z510 Encounter for antineoplastic radiation therapy: Secondary | ICD-10-CM | POA: Insufficient documentation

## 2020-09-16 DIAGNOSIS — C775 Secondary and unspecified malignant neoplasm of intrapelvic lymph nodes: Secondary | ICD-10-CM | POA: Insufficient documentation

## 2020-09-17 ENCOUNTER — Ambulatory Visit
Admission: RE | Admit: 2020-09-17 | Discharge: 2020-09-17 | Disposition: A | Discharge status: Home / Self Care | Payer: 59 | Source: Ambulatory Visit | Attending: Radiation Oncology | Admitting: Radiation Oncology

## 2020-09-17 DIAGNOSIS — Z510 Encounter for antineoplastic radiation therapy: Secondary | ICD-10-CM | POA: Diagnosis not present

## 2020-09-17 NOTE — Progress Notes (Signed)
Pt here for patient teaching.    Pt given Radiation and You booklet and skin care instructions.    Reviewed areas of pertinence such as diarrhea, fatigue, hair loss, nausea and vomiting, sexual and fertility changes, skin changes, and urinary and bladder changes .   Pt able to give teach back of to pat skin, use unscented/gentle soap, use baby wipes, have Imodium on hand, and drink plenty of water,avoid applying anything to skin within 4 hours of treatment.   Pt verbalizes understanding of information given and will contact nursing with any questions or concerns.    [email or web address]

## 2020-09-21 ENCOUNTER — Other Ambulatory Visit: Payer: Self-pay

## 2020-09-21 ENCOUNTER — Ambulatory Visit
Admission: RE | Admit: 2020-09-21 | Discharge: 2020-09-21 | Disposition: A | Discharge status: Home / Self Care | Payer: 59 | Source: Ambulatory Visit | Attending: Radiation Oncology | Admitting: Radiation Oncology

## 2020-09-21 DIAGNOSIS — Z510 Encounter for antineoplastic radiation therapy: Secondary | ICD-10-CM | POA: Diagnosis not present

## 2020-09-22 ENCOUNTER — Ambulatory Visit
Admission: RE | Admit: 2020-09-22 | Discharge: 2020-09-22 | Disposition: A | Discharge status: Home / Self Care | Payer: 59 | Source: Ambulatory Visit | Attending: Radiation Oncology | Admitting: Radiation Oncology

## 2020-09-22 DIAGNOSIS — Z510 Encounter for antineoplastic radiation therapy: Secondary | ICD-10-CM | POA: Diagnosis not present

## 2020-09-23 ENCOUNTER — Other Ambulatory Visit: Payer: Self-pay

## 2020-09-23 ENCOUNTER — Ambulatory Visit
Admission: RE | Admit: 2020-09-23 | Discharge: 2020-09-23 | Disposition: A | Discharge status: Home / Self Care | Payer: 59 | Source: Ambulatory Visit | Attending: Radiation Oncology | Admitting: Radiation Oncology

## 2020-09-23 DIAGNOSIS — Z510 Encounter for antineoplastic radiation therapy: Secondary | ICD-10-CM | POA: Diagnosis not present

## 2020-09-24 ENCOUNTER — Ambulatory Visit: Admission: RE | Admit: 2020-09-24 | Payer: 59 | Source: Ambulatory Visit

## 2020-09-27 ENCOUNTER — Ambulatory Visit
Admission: RE | Admit: 2020-09-27 | Discharge: 2020-09-27 | Disposition: A | Discharge status: Home / Self Care | Payer: 59 | Source: Ambulatory Visit | Attending: Radiation Oncology | Admitting: Radiation Oncology

## 2020-09-27 ENCOUNTER — Other Ambulatory Visit: Payer: Self-pay

## 2020-09-27 DIAGNOSIS — Z510 Encounter for antineoplastic radiation therapy: Secondary | ICD-10-CM | POA: Diagnosis not present

## 2020-09-28 ENCOUNTER — Ambulatory Visit
Admission: RE | Admit: 2020-09-28 | Discharge: 2020-09-28 | Disposition: A | Discharge status: Home / Self Care | Payer: 59 | Source: Ambulatory Visit | Attending: Radiation Oncology | Admitting: Radiation Oncology

## 2020-09-28 DIAGNOSIS — Z510 Encounter for antineoplastic radiation therapy: Secondary | ICD-10-CM | POA: Diagnosis not present

## 2020-09-29 ENCOUNTER — Ambulatory Visit
Admission: RE | Admit: 2020-09-29 | Discharge: 2020-09-29 | Disposition: A | Discharge status: Home / Self Care | Payer: 59 | Source: Ambulatory Visit | Attending: Radiation Oncology | Admitting: Radiation Oncology

## 2020-09-29 ENCOUNTER — Other Ambulatory Visit: Payer: Self-pay

## 2020-09-29 DIAGNOSIS — Z510 Encounter for antineoplastic radiation therapy: Secondary | ICD-10-CM | POA: Diagnosis not present

## 2020-09-30 ENCOUNTER — Ambulatory Visit
Admission: RE | Admit: 2020-09-30 | Discharge: 2020-09-30 | Disposition: A | Discharge status: Home / Self Care | Payer: 59 | Source: Ambulatory Visit | Attending: Radiation Oncology | Admitting: Radiation Oncology

## 2020-09-30 DIAGNOSIS — Z510 Encounter for antineoplastic radiation therapy: Secondary | ICD-10-CM | POA: Diagnosis not present

## 2020-10-01 ENCOUNTER — Other Ambulatory Visit: Payer: Self-pay

## 2020-10-01 ENCOUNTER — Ambulatory Visit
Admission: RE | Admit: 2020-10-01 | Discharge: 2020-10-01 | Disposition: A | Discharge status: Home / Self Care | Payer: 59 | Source: Ambulatory Visit | Attending: Radiation Oncology | Admitting: Radiation Oncology

## 2020-10-01 DIAGNOSIS — Z510 Encounter for antineoplastic radiation therapy: Secondary | ICD-10-CM | POA: Diagnosis not present

## 2020-10-04 ENCOUNTER — Other Ambulatory Visit: Payer: Self-pay

## 2020-10-04 ENCOUNTER — Ambulatory Visit
Admission: RE | Admit: 2020-10-04 | Discharge: 2020-10-04 | Disposition: A | Discharge status: Home / Self Care | Payer: 59 | Source: Ambulatory Visit | Attending: Radiation Oncology | Admitting: Radiation Oncology

## 2020-10-04 DIAGNOSIS — Z510 Encounter for antineoplastic radiation therapy: Secondary | ICD-10-CM | POA: Diagnosis not present

## 2020-10-05 ENCOUNTER — Ambulatory Visit
Admission: RE | Admit: 2020-10-05 | Discharge: 2020-10-05 | Disposition: A | Discharge status: Home / Self Care | Payer: 59 | Source: Ambulatory Visit | Attending: Radiation Oncology | Admitting: Radiation Oncology

## 2020-10-05 DIAGNOSIS — Z510 Encounter for antineoplastic radiation therapy: Secondary | ICD-10-CM | POA: Diagnosis not present

## 2020-10-06 ENCOUNTER — Ambulatory Visit
Admission: RE | Admit: 2020-10-06 | Discharge: 2020-10-06 | Disposition: A | Discharge status: Home / Self Care | Payer: 59 | Source: Ambulatory Visit | Attending: Radiation Oncology | Admitting: Radiation Oncology

## 2020-10-06 ENCOUNTER — Other Ambulatory Visit: Payer: Self-pay

## 2020-10-06 DIAGNOSIS — Z510 Encounter for antineoplastic radiation therapy: Secondary | ICD-10-CM | POA: Diagnosis not present

## 2020-10-07 ENCOUNTER — Ambulatory Visit
Admission: RE | Admit: 2020-10-07 | Discharge: 2020-10-07 | Disposition: A | Discharge status: Home / Self Care | Payer: 59 | Source: Ambulatory Visit | Attending: Radiation Oncology | Admitting: Radiation Oncology

## 2020-10-07 DIAGNOSIS — Z510 Encounter for antineoplastic radiation therapy: Secondary | ICD-10-CM | POA: Diagnosis not present

## 2020-10-08 ENCOUNTER — Other Ambulatory Visit: Payer: Self-pay

## 2020-10-08 ENCOUNTER — Ambulatory Visit
Admission: RE | Admit: 2020-10-08 | Discharge: 2020-10-08 | Disposition: A | Discharge status: Home / Self Care | Payer: 59 | Source: Ambulatory Visit | Attending: Radiation Oncology | Admitting: Radiation Oncology

## 2020-10-08 DIAGNOSIS — Z510 Encounter for antineoplastic radiation therapy: Secondary | ICD-10-CM | POA: Diagnosis not present

## 2020-10-11 ENCOUNTER — Ambulatory Visit
Admission: RE | Admit: 2020-10-11 | Discharge: 2020-10-11 | Disposition: A | Discharge status: Home / Self Care | Payer: 59 | Source: Ambulatory Visit | Attending: Radiation Oncology | Admitting: Radiation Oncology

## 2020-10-11 ENCOUNTER — Other Ambulatory Visit: Payer: Self-pay

## 2020-10-11 DIAGNOSIS — Z510 Encounter for antineoplastic radiation therapy: Secondary | ICD-10-CM | POA: Diagnosis not present

## 2020-10-12 ENCOUNTER — Other Ambulatory Visit: Payer: Self-pay

## 2020-10-12 ENCOUNTER — Ambulatory Visit
Admission: RE | Admit: 2020-10-12 | Discharge: 2020-10-12 | Disposition: A | Discharge status: Home / Self Care | Payer: 59 | Source: Ambulatory Visit | Attending: Radiation Oncology | Admitting: Radiation Oncology

## 2020-10-12 DIAGNOSIS — Z510 Encounter for antineoplastic radiation therapy: Secondary | ICD-10-CM | POA: Diagnosis not present

## 2020-10-13 ENCOUNTER — Ambulatory Visit
Admission: RE | Admit: 2020-10-13 | Discharge: 2020-10-13 | Disposition: A | Discharge status: Home / Self Care | Payer: 59 | Source: Ambulatory Visit | Attending: Radiation Oncology | Admitting: Radiation Oncology

## 2020-10-13 DIAGNOSIS — Z510 Encounter for antineoplastic radiation therapy: Secondary | ICD-10-CM | POA: Diagnosis not present

## 2020-10-14 ENCOUNTER — Other Ambulatory Visit: Payer: Self-pay

## 2020-10-14 ENCOUNTER — Ambulatory Visit
Admission: RE | Admit: 2020-10-14 | Discharge: 2020-10-14 | Disposition: A | Discharge status: Home / Self Care | Payer: 59 | Source: Ambulatory Visit | Attending: Radiation Oncology | Admitting: Radiation Oncology

## 2020-10-14 DIAGNOSIS — Z510 Encounter for antineoplastic radiation therapy: Secondary | ICD-10-CM | POA: Diagnosis not present

## 2020-10-15 ENCOUNTER — Ambulatory Visit
Admission: RE | Admit: 2020-10-15 | Discharge: 2020-10-15 | Disposition: A | Discharge status: Home / Self Care | Payer: 59 | Source: Ambulatory Visit | Attending: Radiation Oncology | Admitting: Radiation Oncology

## 2020-10-15 DIAGNOSIS — Z510 Encounter for antineoplastic radiation therapy: Secondary | ICD-10-CM | POA: Diagnosis not present

## 2020-10-18 ENCOUNTER — Ambulatory Visit: Payer: 59

## 2020-10-19 ENCOUNTER — Ambulatory Visit
Admission: RE | Admit: 2020-10-19 | Discharge: 2020-10-19 | Disposition: A | Discharge status: Home / Self Care | Payer: 59 | Source: Ambulatory Visit | Attending: Radiation Oncology | Admitting: Radiation Oncology

## 2020-10-19 ENCOUNTER — Other Ambulatory Visit: Payer: Self-pay

## 2020-10-19 DIAGNOSIS — C61 Malignant neoplasm of prostate: Secondary | ICD-10-CM | POA: Insufficient documentation

## 2020-10-19 DIAGNOSIS — C775 Secondary and unspecified malignant neoplasm of intrapelvic lymph nodes: Secondary | ICD-10-CM | POA: Diagnosis not present

## 2020-10-19 DIAGNOSIS — Z510 Encounter for antineoplastic radiation therapy: Secondary | ICD-10-CM | POA: Insufficient documentation

## 2020-10-20 ENCOUNTER — Ambulatory Visit
Admission: RE | Admit: 2020-10-20 | Discharge: 2020-10-20 | Disposition: A | Discharge status: Home / Self Care | Payer: 59 | Source: Ambulatory Visit | Attending: Radiation Oncology | Admitting: Radiation Oncology

## 2020-10-20 ENCOUNTER — Ambulatory Visit: Payer: 59

## 2020-10-20 DIAGNOSIS — Z510 Encounter for antineoplastic radiation therapy: Secondary | ICD-10-CM | POA: Diagnosis not present

## 2020-10-21 ENCOUNTER — Ambulatory Visit
Admission: RE | Admit: 2020-10-21 | Discharge: 2020-10-21 | Disposition: A | Discharge status: Home / Self Care | Payer: 59 | Source: Ambulatory Visit | Attending: Radiation Oncology | Admitting: Radiation Oncology

## 2020-10-21 ENCOUNTER — Ambulatory Visit: Payer: 59

## 2020-10-21 ENCOUNTER — Other Ambulatory Visit: Payer: Self-pay

## 2020-10-21 DIAGNOSIS — Z510 Encounter for antineoplastic radiation therapy: Secondary | ICD-10-CM | POA: Diagnosis not present

## 2020-10-22 ENCOUNTER — Ambulatory Visit: Payer: 59

## 2020-10-22 ENCOUNTER — Ambulatory Visit
Admission: RE | Admit: 2020-10-22 | Discharge: 2020-10-22 | Disposition: A | Discharge status: Home / Self Care | Payer: 59 | Source: Ambulatory Visit | Attending: Radiation Oncology | Admitting: Radiation Oncology

## 2020-10-22 DIAGNOSIS — Z510 Encounter for antineoplastic radiation therapy: Secondary | ICD-10-CM | POA: Diagnosis not present

## 2020-10-25 ENCOUNTER — Ambulatory Visit
Admission: RE | Admit: 2020-10-25 | Discharge: 2020-10-25 | Disposition: A | Discharge status: Home / Self Care | Payer: 59 | Source: Ambulatory Visit | Attending: Radiation Oncology | Admitting: Radiation Oncology

## 2020-10-25 ENCOUNTER — Other Ambulatory Visit: Payer: Self-pay

## 2020-10-25 DIAGNOSIS — Z510 Encounter for antineoplastic radiation therapy: Secondary | ICD-10-CM | POA: Diagnosis not present

## 2020-10-26 ENCOUNTER — Ambulatory Visit
Admission: RE | Admit: 2020-10-26 | Discharge: 2020-10-26 | Disposition: A | Discharge status: Home / Self Care | Payer: 59 | Source: Ambulatory Visit | Attending: Radiation Oncology | Admitting: Radiation Oncology

## 2020-10-26 DIAGNOSIS — Z510 Encounter for antineoplastic radiation therapy: Secondary | ICD-10-CM | POA: Diagnosis not present

## 2020-10-27 ENCOUNTER — Ambulatory Visit
Admission: RE | Admit: 2020-10-27 | Discharge: 2020-10-27 | Disposition: A | Discharge status: Home / Self Care | Payer: 59 | Source: Ambulatory Visit | Attending: Radiation Oncology | Admitting: Radiation Oncology

## 2020-10-27 ENCOUNTER — Other Ambulatory Visit: Payer: Self-pay

## 2020-10-27 DIAGNOSIS — Z510 Encounter for antineoplastic radiation therapy: Secondary | ICD-10-CM | POA: Diagnosis not present

## 2020-10-28 ENCOUNTER — Ambulatory Visit
Admission: RE | Admit: 2020-10-28 | Discharge: 2020-10-28 | Disposition: A | Discharge status: Home / Self Care | Payer: 59 | Source: Ambulatory Visit | Attending: Radiation Oncology | Admitting: Radiation Oncology

## 2020-10-28 DIAGNOSIS — Z510 Encounter for antineoplastic radiation therapy: Secondary | ICD-10-CM | POA: Diagnosis not present

## 2020-10-29 ENCOUNTER — Ambulatory Visit
Admission: RE | Admit: 2020-10-29 | Discharge: 2020-10-29 | Disposition: A | Discharge status: Home / Self Care | Payer: 59 | Source: Ambulatory Visit | Attending: Radiation Oncology | Admitting: Radiation Oncology

## 2020-10-29 DIAGNOSIS — Z510 Encounter for antineoplastic radiation therapy: Secondary | ICD-10-CM | POA: Diagnosis not present

## 2020-11-01 ENCOUNTER — Other Ambulatory Visit: Payer: Self-pay

## 2020-11-01 ENCOUNTER — Ambulatory Visit
Admission: RE | Admit: 2020-11-01 | Discharge: 2020-11-01 | Disposition: A | Discharge status: Home / Self Care | Payer: 59 | Source: Ambulatory Visit | Attending: Radiation Oncology | Admitting: Radiation Oncology

## 2020-11-01 DIAGNOSIS — Z510 Encounter for antineoplastic radiation therapy: Secondary | ICD-10-CM | POA: Diagnosis not present

## 2020-11-02 ENCOUNTER — Ambulatory Visit
Admission: RE | Admit: 2020-11-02 | Discharge: 2020-11-02 | Disposition: A | Discharge status: Home / Self Care | Payer: 59 | Source: Ambulatory Visit | Attending: Radiation Oncology | Admitting: Radiation Oncology

## 2020-11-02 DIAGNOSIS — Z510 Encounter for antineoplastic radiation therapy: Secondary | ICD-10-CM | POA: Diagnosis not present

## 2020-11-03 ENCOUNTER — Ambulatory Visit
Admission: RE | Admit: 2020-11-03 | Discharge: 2020-11-03 | Disposition: A | Discharge status: Home / Self Care | Payer: 59 | Source: Ambulatory Visit | Attending: Radiation Oncology | Admitting: Radiation Oncology

## 2020-11-03 ENCOUNTER — Other Ambulatory Visit: Payer: Self-pay

## 2020-11-03 DIAGNOSIS — Z510 Encounter for antineoplastic radiation therapy: Secondary | ICD-10-CM | POA: Diagnosis not present

## 2020-11-04 ENCOUNTER — Ambulatory Visit
Admission: RE | Admit: 2020-11-04 | Discharge: 2020-11-04 | Disposition: A | Discharge status: Home / Self Care | Payer: 59 | Source: Ambulatory Visit | Attending: Radiation Oncology | Admitting: Radiation Oncology

## 2020-11-04 DIAGNOSIS — Z510 Encounter for antineoplastic radiation therapy: Secondary | ICD-10-CM | POA: Diagnosis not present

## 2020-11-05 ENCOUNTER — Ambulatory Visit
Admission: RE | Admit: 2020-11-05 | Discharge: 2020-11-05 | Disposition: A | Discharge status: Home / Self Care | Payer: 59 | Source: Ambulatory Visit | Attending: Radiation Oncology | Admitting: Radiation Oncology

## 2020-11-05 ENCOUNTER — Other Ambulatory Visit: Payer: Self-pay

## 2020-11-05 ENCOUNTER — Ambulatory Visit: Payer: 59

## 2020-11-05 DIAGNOSIS — Z510 Encounter for antineoplastic radiation therapy: Secondary | ICD-10-CM | POA: Diagnosis not present

## 2020-11-06 ENCOUNTER — Ambulatory Visit: Payer: 59

## 2020-11-08 ENCOUNTER — Ambulatory Visit
Admission: RE | Admit: 2020-11-08 | Discharge: 2020-11-08 | Disposition: A | Discharge status: Home / Self Care | Payer: 59 | Source: Ambulatory Visit | Attending: Radiation Oncology | Admitting: Radiation Oncology

## 2020-11-08 ENCOUNTER — Ambulatory Visit: Payer: 59

## 2020-11-08 ENCOUNTER — Other Ambulatory Visit: Payer: Self-pay

## 2020-11-08 DIAGNOSIS — Z510 Encounter for antineoplastic radiation therapy: Secondary | ICD-10-CM | POA: Diagnosis not present

## 2020-11-09 ENCOUNTER — Ambulatory Visit
Admission: RE | Admit: 2020-11-09 | Discharge: 2020-11-09 | Disposition: A | Discharge status: Home / Self Care | Payer: 59 | Source: Ambulatory Visit | Attending: Radiation Oncology | Admitting: Radiation Oncology

## 2020-11-09 ENCOUNTER — Encounter: Payer: Self-pay | Admitting: Urology

## 2020-11-09 ENCOUNTER — Other Ambulatory Visit: Payer: Self-pay

## 2020-11-09 DIAGNOSIS — C61 Malignant neoplasm of prostate: Secondary | ICD-10-CM

## 2020-11-09 DIAGNOSIS — C775 Secondary and unspecified malignant neoplasm of intrapelvic lymph nodes: Secondary | ICD-10-CM

## 2020-11-09 DIAGNOSIS — Z510 Encounter for antineoplastic radiation therapy: Secondary | ICD-10-CM | POA: Diagnosis not present

## 2020-12-15 ENCOUNTER — Encounter: Payer: Self-pay | Admitting: Urology

## 2020-12-15 ENCOUNTER — Ambulatory Visit
Admission: RE | Admit: 2020-12-15 | Discharge: 2020-12-15 | Disposition: A | Discharge status: Home / Self Care | Payer: 59 | Source: Ambulatory Visit | Attending: Urology | Admitting: Urology

## 2020-12-15 DIAGNOSIS — C61 Malignant neoplasm of prostate: Secondary | ICD-10-CM

## 2020-12-15 NOTE — Progress Notes (Signed)
Patient states doing well. No symptoms reported at this time.I-PSS Score of 2 (mild).  Meaningful use complete.  Currently NO urinary management medications and urology follow-up scheduled for January 2023 -per patient.  Patient notified of 10:30am-12/15/20 telephone appointment and verbalized understanding.  Patient preferred contact # [phone]

## 2020-12-15 NOTE — Progress Notes (Signed)
Radiation Oncology         (336) [phone] ________________________________  Name: John Fleener Schaefer MRN: [id number]  Date: 12/15/2020  DOB: [birth year]  Post Treatment Note  CC: Vassie Moment, MD (Inactive)  Raynelle Bring, MD  Diagnosis:   65 y.o. 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     Interval Since Last Radiation:  5 weeks; concurrent with ADT (24-month Eligard injection 03/23/2020) 09/14/20 - 11/09/20: 1. The prostate fossa and pelvic lymph nodes were initially treated to 45 Gy in 25 fractions of 1.8 Gy  2. The prostate fossa only was boosted to 68.4 Gy with 13 additional fractions of 1.8 Gy with simultaneous boost to the prescacral node to 71 Gy in 13 fractions of 2 Gy  Narrative:  I spoke with the patient to conduct his routine scheduled 1 month follow up visit via telephone to spare the patient unnecessary potential exposure in the healthcare setting during the current COVID-19 pandemic.  The patient was notified in advance and gave permission to proceed with this visit format.  He tolerated radiation treatment relatively well with only minor urinary irritation and modest fatigue.  He reported increased nocturia every 1-1/2 to 2 hours and occasional, soft stools but specifically denied abdominal pain, nausea, vomiting, diarrhea, constipation, gross hematuria, dysuria, straining to void or incomplete bladder emptying.                              On review of systems, the patient states that he is doing very well in general.  He really tolerated the radiation quite well with minimal if any change from baseline even during treatment.  At this point, he feels like he is back to his baseline regarding bowel and bladder function.  He specifically denies dysuria, gross hematuria, straining to void, incomplete bladder emptying or incontinence.   He reports a healthy appetite and is maintaining his weight.  He denies abdominal pain, nausea, vomiting, diarrhea or constipation.  He has not noticed any significant impact on his energy level and has been able to remain active.  He continues to tolerate ADT very well and denies hot flashes or decreased stamina.  Overall, he is quite pleased with his progress to date.  ALLERGIES:  has No Known Allergies.  Meds: Current Outpatient Medications  Medication Sig Dispense Refill   amLODipine (NORVASC) 10 MG tablet Take 1 tablet (10 mg total) by mouth daily. 90 tablet 3   atorvastatin (LIPITOR) 20 MG tablet TAKE 1 TABLET BY MOUTH ONCE DAILY (Patient taking differently: Take 20 mg by mouth daily.) 30 tablet 0   hydrochlorothiazide (HYDRODIURIL) 25 MG tablet Take 25 mg by mouth daily.     losartan (COZAAR) 100 MG tablet Take 100 mg by mouth daily.     metFORMIN (GLUCOPHAGE) 1000 MG tablet Take 1,000 mg by mouth 2 (two) times daily with a meal.     sildenafil (REVATIO) 20 MG tablet Take 40 mg by mouth daily.     traMADol (ULTRAM) 50 MG tablet Take 1-2 tablets (50-100 mg total) by mouth every 6 (six) hours as needed for moderate pain or severe pain. (Patient not taking: Reported on 08/31/2020) 20 tablet 0   No current facility-administered medications for this encounter.    Physical Findings:  vitals were not taken for this visit.  Pain Assessment Pain Score: 0-No pain/10 Unable to assess due to telephone follow-up visit format.  Lab Findings: Lab Results  Component Value Date   WBC 10.5 09/30/2019   HGB 11.9 (L) 10/07/2019   HCT 34.9 (L) 10/07/2019   MCV 86.2 09/30/2019   PLT 280 09/30/2019     Radiographic Findings: No results found.  Impression/Plan: 1. 65 y.o. 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     He will continue to  follow up with urology for ongoing PSA determinations and has an appointment scheduled with Dr. Alinda Money in January 2023. He understands what to expect with regards to PSA monitoring going forward. I will look forward to following his response to treatment via correspondence with urology, and would be happy to continue to participate in his care if clinically indicated. I talked to the patient about what to expect in the future, including his risk for erectile dysfunction and rectal bleeding. I encouraged him to call or return to the office if he has any questions regarding his previous radiation or possible radiation side effects. He was comfortable with this plan and will follow up as needed.     Nicholos Johns, PA-C

## 2020-12-15 NOTE — Progress Notes (Addendum)
  Radiation Oncology         [phone] ________________________________  Name: John Schaefer MRN: [id number]  Date: 11/09/2020  DOB: 10/20/1955  End of Treatment Note  Diagnosis:   65 y.o. 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     Indication for treatment:  Curative, Definitive Radiotherapy       Radiation treatment dates:   09/14/20 - 11/09/20  Site/dose:  1. The prostate fossa and pelvic lymph nodes were initially treated to 45 Gy in 25 fractions of 1.8 Gy  2. The prostate fossa only was boosted to 68.4 Gy with 13 additional fractions of 1.8 Gy with simultaneous boost to the prescacral node to 71 Gy in 13 fractions of 2 Gy  Beams/energy:  1. The prostate fossa  and pelvic lymph nodes were initially treated using VMAT intensity modulated radiotherapy delivering 6 megavolt photons. Image guidance was performed with CB-CT studies prior to each fraction. He was immobilized with a body fix lower extremity mold.  2. The prostate fossa and presacral node were boosted using VMAT intensity modulated radiotherapy delivering 6 megavolt photons. Image guidance was performed with CB-CT studies prior to each fraction. He was immobilized with a body fix lower extremity mold.  Narrative: The patient tolerated radiation treatment relatively well with only minor urinary irritation and modest fatigue.  He reported increased nocturia every 1-1/2 to 2 hours and occasional, soft stools but specifically denied abdominal pain, nausea, vomiting, diarrhea, constipation, gross hematuria, dysuria, straining to void or incomplete bladder emptying.  Plan: The patient has completed radiation treatment. He will return to radiation oncology clinic for routine followup in one month. I advised him to call or return sooner if he has any questions or concerns related  to his recovery or treatment. ________________________________  Sheral Apley. Tammi Klippel, M.D.

## 2021-04-08 ENCOUNTER — Emergency Department (HOSPITAL_COMMUNITY): Payer: Medicare Other

## 2021-04-08 ENCOUNTER — Other Ambulatory Visit: Payer: Self-pay

## 2021-04-08 ENCOUNTER — Inpatient Hospital Stay (HOSPITAL_COMMUNITY)
Admission: EM | Admit: 2021-04-08 | Discharge: 2021-04-11 | DRG: 175 | Disposition: A | Discharge status: Skilled Nursing Facility | Payer: Medicare Other | Attending: Internal Medicine | Admitting: Internal Medicine

## 2021-04-08 DIAGNOSIS — Z9079 Acquired absence of other genital organ(s): Secondary | ICD-10-CM

## 2021-04-08 DIAGNOSIS — I2699 Other pulmonary embolism without acute cor pulmonale: Secondary | ICD-10-CM | POA: Diagnosis not present

## 2021-04-08 DIAGNOSIS — Z20822 Contact with and (suspected) exposure to covid-19: Secondary | ICD-10-CM | POA: Diagnosis present

## 2021-04-08 DIAGNOSIS — J9601 Acute respiratory failure with hypoxia: Secondary | ICD-10-CM | POA: Diagnosis not present

## 2021-04-08 DIAGNOSIS — N179 Acute kidney failure, unspecified: Secondary | ICD-10-CM

## 2021-04-08 DIAGNOSIS — Z8546 Personal history of malignant neoplasm of prostate: Secondary | ICD-10-CM

## 2021-04-08 DIAGNOSIS — E669 Obesity, unspecified: Secondary | ICD-10-CM | POA: Diagnosis present

## 2021-04-08 DIAGNOSIS — E876 Hypokalemia: Secondary | ICD-10-CM

## 2021-04-08 DIAGNOSIS — Z8673 Personal history of transient ischemic attack (TIA), and cerebral infarction without residual deficits: Secondary | ICD-10-CM

## 2021-04-08 DIAGNOSIS — Z7984 Long term (current) use of oral hypoglycemic drugs: Secondary | ICD-10-CM

## 2021-04-08 DIAGNOSIS — Z79899 Other long term (current) drug therapy: Secondary | ICD-10-CM

## 2021-04-08 DIAGNOSIS — Z8249 Family history of ischemic heart disease and other diseases of the circulatory system: Secondary | ICD-10-CM

## 2021-04-08 DIAGNOSIS — Z833 Family history of diabetes mellitus: Secondary | ICD-10-CM

## 2021-04-08 DIAGNOSIS — C61 Malignant neoplasm of prostate: Secondary | ICD-10-CM | POA: Diagnosis present

## 2021-04-08 DIAGNOSIS — Z923 Personal history of irradiation: Secondary | ICD-10-CM

## 2021-04-08 DIAGNOSIS — Z6833 Body mass index (BMI) 33.0-33.9, adult: Secondary | ICD-10-CM

## 2021-04-08 DIAGNOSIS — I10 Essential (primary) hypertension: Secondary | ICD-10-CM | POA: Diagnosis present

## 2021-04-08 DIAGNOSIS — E119 Type 2 diabetes mellitus without complications: Secondary | ICD-10-CM | POA: Diagnosis present

## 2021-04-08 DIAGNOSIS — J189 Pneumonia, unspecified organism: Secondary | ICD-10-CM

## 2021-04-08 DIAGNOSIS — E785 Hyperlipidemia, unspecified: Secondary | ICD-10-CM | POA: Diagnosis present

## 2021-04-08 LAB — CBC WITH DIFFERENTIAL/PLATELET
Abs Immature Granulocytes: 0.07 10*3/uL (ref 0.00–0.07)
Basophils Absolute: 0.1 10*3/uL (ref 0.0–0.1)
Basophils Relative: 0 %
Eosinophils Absolute: 0.1 10*3/uL (ref 0.0–0.5)
Eosinophils Relative: 0 %
HCT: 40.9 % (ref 39.0–52.0)
Hemoglobin: 13 g/dL (ref 13.0–17.0)
Immature Granulocytes: 1 %
Lymphocytes Relative: 8 %
Lymphs Abs: 0.9 10*3/uL (ref 0.7–4.0)
MCH: 28.1 pg (ref 26.0–34.0)
MCHC: 31.8 g/dL (ref 30.0–36.0)
MCV: 88.3 fL (ref 80.0–100.0)
Monocytes Absolute: 0.7 10*3/uL (ref 0.1–1.0)
Monocytes Relative: 5 %
Neutro Abs: 10.6 10*3/uL — ABNORMAL HIGH (ref 1.7–7.7)
Neutrophils Relative %: 86 %
Platelets: 273 10*3/uL (ref 150–400)
RBC: 4.63 MIL/uL (ref 4.22–5.81)
RDW: 13.8 % (ref 11.5–15.5)
WBC: 12.4 10*3/uL — ABNORMAL HIGH (ref 4.0–10.5)
nRBC: 0.2 % (ref 0.0–0.2)

## 2021-04-08 LAB — BRAIN NATRIURETIC PEPTIDE: B Natriuretic Peptide: 17.1 pg/mL (ref 0.0–100.0)

## 2021-04-08 LAB — TROPONIN I (HIGH SENSITIVITY)
Troponin I (High Sensitivity): 19 ng/L — ABNORMAL HIGH (ref <18)
Troponin I (High Sensitivity): 18 ng/L — ABNORMAL HIGH (ref <18)

## 2021-04-08 LAB — HEPATIC FUNCTION PANEL
ALT: 26 U/L (ref 0–44)
AST: 24 U/L (ref 15–41)
Albumin: 2.9 g/dL — ABNORMAL LOW (ref 3.5–5.0)
Alkaline Phosphatase: 61 U/L (ref 38–126)
Bilirubin, Direct: 0.1 mg/dL (ref 0.0–0.2)
Indirect Bilirubin: 0.6 mg/dL (ref 0.3–0.9)
Total Bilirubin: 0.7 mg/dL (ref 0.3–1.2)
Total Protein: 7.5 g/dL (ref 6.5–8.1)

## 2021-04-08 LAB — BASIC METABOLIC PANEL
Anion gap: 8 (ref 5–15)
BUN: 21 mg/dL (ref 8–23)
CO2: 33 mmol/L — ABNORMAL HIGH (ref 22–32)
Calcium: 8.7 mg/dL — ABNORMAL LOW (ref 8.9–10.3)
Chloride: 98 mmol/L (ref 98–111)
Creatinine, Ser: 1.89 mg/dL — ABNORMAL HIGH (ref 0.61–1.24)
GFR, Estimated: 39 mL/min — ABNORMAL LOW (ref >60)
Glucose, Bld: 134 mg/dL — ABNORMAL HIGH (ref 70–99)
Potassium: 3.2 mmol/L — ABNORMAL LOW (ref 3.5–5.1)
Sodium: 139 mmol/L (ref 135–145)

## 2021-04-08 LAB — RESP PANEL BY RT-PCR (FLU A&B, COVID) ARPGX2
Influenza A by PCR: NEGATIVE
Influenza B by PCR: NEGATIVE
SARS Coronavirus 2 by RT PCR: NEGATIVE

## 2021-04-08 LAB — LACTIC ACID, PLASMA: Lactic Acid, Venous: 1 mmol/L (ref 0.5–1.9)

## 2021-04-08 MED ORDER — HEPARIN (PORCINE) 25000 UT/250ML-% IV SOLN
1250.0000 [IU]/h | INTRAVENOUS | Status: AC
  Administered 2021-04-08: 1600 [IU]/h via INTRAVENOUS
  Administered 2021-04-09: 1400 [IU]/h via INTRAVENOUS
  Administered 2021-04-10: 1250 [IU]/h via INTRAVENOUS
  Filled 2021-04-08 (×3): qty 250

## 2021-04-08 MED ORDER — SODIUM CHLORIDE 0.9 % IV SOLN
500.0000 mg | Freq: Once | INTRAVENOUS | Status: AC
  Administered 2021-04-08: 500 mg via INTRAVENOUS
  Filled 2021-04-08: qty 5

## 2021-04-08 MED ORDER — IOHEXOL 350 MG/ML SOLN
100.0000 mL | Freq: Once | INTRAVENOUS | Status: AC | PRN
  Administered 2021-04-08: 100 mL via INTRAVENOUS

## 2021-04-08 MED ORDER — HEPARIN BOLUS VIA INFUSION
6000.0000 [IU] | Freq: Once | INTRAVENOUS | Status: AC
  Administered 2021-04-08: 6000 [IU] via INTRAVENOUS
  Filled 2021-04-08: qty 6000

## 2021-04-08 MED ORDER — SODIUM CHLORIDE 0.9 % IV SOLN
1.0000 g | Freq: Once | INTRAVENOUS | Status: AC
  Administered 2021-04-08: 1 g via INTRAVENOUS
  Filled 2021-04-08: qty 10

## 2021-04-08 MED ORDER — ATORVASTATIN CALCIUM 10 MG PO TABS
20.0000 mg | ORAL_TABLET | Freq: Every day | ORAL | Status: DC
  Administered 2021-04-08 – 2021-04-11 (×4): 20 mg via ORAL
  Filled 2021-04-08 (×4): qty 2

## 2021-04-08 NOTE — ED Notes (Signed)
IV team at pt bedside 

## 2021-04-08 NOTE — ED Provider Notes (Signed)
?[facility] ?Provider Note ? ? ?CSN: [id number] ?Arrival date & time: 04/08/21  1528 ? ?  ? ?History ? ?Chief Complaint  ?Patient presents with  ? Shortness of Breath  ? ? ?John Schaefer is a 66 y.o. male. ? ?The history is provided by the patient.  ?Shortness of Breath ?Severity:  Moderate ?Onset quality:  Gradual ?Duration:  4 days ?Timing:  Constant ?Progression:  Worsening ?Chronicity:  New ?Context: activity   ?Relieved by:  Rest ?Worsened by:  Exertion ?Associated symptoms: cough   ?Associated symptoms: no abdominal pain, no chest pain, no claudication, no diaphoresis, no ear pain, no fever, no headaches, no hemoptysis, no neck pain, no PND, no rash, no sore throat, no sputum production, no syncope, no swollen glands, no vomiting and no wheezing   ?Risk factors: no hx of PE/DVT, no prolonged immobilization, no recent surgery and no tobacco use   ? ?  ? ?Home Medications ?Prior to Admission medications   ?Medication Sig Start Date End Date Taking? Authorizing Provider  ?amLODipine (NORVASC) 10 MG tablet Take 1 tablet (10 mg total) by mouth daily. 08/12/16   Harrison Mons, PA  ?atorvastatin (LIPITOR) 20 MG tablet TAKE 1 TABLET BY MOUTH ONCE DAILY ?Patient taking differently: Take 20 mg by mouth daily. 07/08/16   Harrison Mons, PA  ?hydrochlorothiazide (HYDRODIURIL) 25 MG tablet Take 25 mg by mouth daily. 08/16/19   [provider]  ?losartan (COZAAR) 100 MG tablet Take 100 mg by mouth daily. 08/16/19   [provider]  ?metFORMIN (GLUCOPHAGE) 1000 MG tablet Take 1,000 mg by mouth 2 (two) times daily with a meal.    [provider]  ?sildenafil (REVATIO) 20 MG tablet Take 40 mg by mouth daily. 08/27/20   [provider]  ?traMADol (ULTRAM) 50 MG tablet Take 1-2 tablets (50-100 mg total) by mouth every 6 (six) hours as needed for moderate pain or severe pain. ?Patient not taking: Reported on 08/31/2020 10/06/19   Debbrah Alar, PA-C  ?    ? ?Allergies    ?Patient has no known allergies.   ? ?Review of Systems   ?Review of Systems  ?Constitutional:  Negative for diaphoresis and fever.  ?HENT:  Negative for ear pain and sore throat.   ?Respiratory:  Positive for cough and shortness of breath. Negative for hemoptysis, sputum production and wheezing.   ?Cardiovascular:  Negative for chest pain, claudication, syncope and PND.  ?Gastrointestinal:  Negative for abdominal pain and vomiting.  ?Musculoskeletal:  Negative for neck pain.  ?Skin:  Negative for rash.  ?Neurological:  Negative for headaches.  ? ?Physical Exam ?Updated Vital Signs ?BP 112/70   Pulse 85   Temp 98.7 ?F (37.1 ?C) (Oral)   Resp (!) 36   Ht '5\' 10"'$  (1.778 m)   Wt 106 kg   SpO2 92%   BMI 33.53 kg/m?  ?Physical Exam ?Vitals and nursing note reviewed.  ?Constitutional:   ?   General: He is not in acute distress. ?   Appearance: He is well-developed. He is not ill-appearing.  ?HENT:  ?   Head: Normocephalic and atraumatic.  ?Eyes:  ?   Conjunctiva/sclera: Conjunctivae normal.  ?   Pupils: Pupils are equal, round, and reactive to light.  ?Cardiovascular:  ?   Rate and Rhythm: Normal rate and regular rhythm.  ?   Pulses: Normal pulses.  ?   Heart sounds: Normal heart sounds. No murmur heard. ?Pulmonary:  ?   Effort: Pulmonary  effort is normal. Tachypnea present. No respiratory distress.  ?   Breath sounds: Decreased breath sounds present.  ?Abdominal:  ?   Palpations: Abdomen is soft.  ?   Tenderness: There is no abdominal tenderness.  ?Musculoskeletal:     ?   General: No swelling. Normal range of motion.  ?   Cervical back: Normal range of motion and neck supple.  ?   Right lower leg: No edema.  ?   Left lower leg: No edema.  ?Skin: ?   General: Skin is warm and dry.  ?   Capillary Refill: Capillary refill takes less than 2 seconds.  ?Neurological:  ?   Mental Status: He is alert.  ?Psychiatric:     ?   Mood and Affect: Mood normal.  ? ? ?ED Results / Procedures / Treatments    ?Labs ?(all labs ordered are listed, but only abnormal results are displayed) ?Labs Reviewed  ?BASIC METABOLIC PANEL - Abnormal; Notable for the following components:  ?    Result Value  ? Potassium 3.2 (*)   ? CO2 33 (*)   ? Glucose, Bld 134 (*)   ? Creatinine, Ser 1.89 (*)   ? Calcium 8.7 (*)   ? GFR, Estimated 39 (*)   ? All other components within normal limits  ?CBC WITH DIFFERENTIAL/PLATELET - Abnormal; Notable for the following components:  ? WBC 12.4 (*)   ? Neutro Abs 10.6 (*)   ? All other components within normal limits  ?HEPATIC FUNCTION PANEL - Abnormal; Notable for the following components:  ? Albumin 2.9 (*)   ? All other components within normal limits  ?TROPONIN I (HIGH SENSITIVITY) - Abnormal; Notable for the following components:  ? Troponin I (High Sensitivity) 19 (*)   ? All other components within normal limits  ?TROPONIN I (HIGH SENSITIVITY) - Abnormal; Notable for the following components:  ? Troponin I (High Sensitivity) 18 (*)   ? All other components within normal limits  ?RESP PANEL BY RT-PCR (FLU A&B, COVID) [id number]  ?CULTURE, BLOOD (ROUTINE X 2)  ?CULTURE, BLOOD (ROUTINE X 2)  ?BRAIN NATRIURETIC PEPTIDE  ?LACTIC ACID, PLASMA  ?LACTIC ACID, PLASMA  ?HEPARIN LEVEL (UNFRACTIONATED)  ?CBC  ? ? ?EKG ?EKG Interpretation ? ?Date/Time:  Friday April 08 2021 15:46:36 EDT ?Ventricular Rate:  99 ?PR Interval:  178 ?QRS Duration: 86 ?QT Interval:  384 ?QTC Calculation: 492 ?R Axis:   6 ?Text Interpretation: Normal sinus rhythm Prolonged QT Abnormal ECG When compared with ECG of 30-Sep-2019 11:38, PREVIOUS ECG IS PRESENT Confirmed by Lennice Sites [id number]) on 04/08/2021 3:57:33 PM ? ?Radiology ?CT Angio Chest PE W and/or Wo Contrast ? ?Result Date: 04/08/2021 ?CLINICAL DATA:  Shortness of breath for several days, prostate cancer EXAM: CT ANGIOGRAPHY CHEST WITH CONTRAST TECHNIQUE: Multidetector CT imaging of the chest was performed using the standard protocol during bolus administration of intravenous  contrast. Multiplanar CT image reconstructions and MIPs were obtained to evaluate the vascular anatomy. RADIATION DOSE REDUCTION: This exam was performed according to the departmental dose-optimization program which includes automated exposure control, adjustment of the mA and/or kV according to patient size and/or use of iterative reconstruction technique. CONTRAST:  154m OMNIPAQUE IOHEXOL 350 MG/ML SOLN COMPARISON:  04/08/2021, 03/30/2020 FINDINGS: Cardiovascular: This is a technically adequate evaluation of the pulmonary vasculature. There are bilateral segmental pulmonary emboli most pronounced within the right upper and left lower lobes. Equivocal segmental emboli within the right lower lobe, evaluation limited by respiratory motion and adjacent lung  consolidation. There is mild clot burden. No evidence of central pulmonary emboli. The RV/LV ratio is calculated at 1.2, though there is no straightening or bowing of the interventricular septum and the configuration of the cardiac chambers appear similar to prior abdominal CT 04/15/2019. This may be related to chronic right ventricular dilatation due to pulmonary arterial hypertension. No evidence of pericardial effusion. Ectasia of the thoracic aorta without aneurysm or dissection. Mild atherosclerosis of the aortic arch and coronary vasculature. Mediastinum/Nodes: No enlarged mediastinal, hilar, or axillary lymph nodes. Thyroid gland, trachea, and esophagus demonstrate no significant findings. Lungs/Pleura: Multifocal areas of dense consolidation are identified, greatest in the right upper and bilateral lower lobes. The appearance is most consistent with multifocal pneumonia, though underlying pulmonary infarct cannot be excluded in light of pulmonary emboli. No effusion or pneumothorax. Central airways are patent. Upper Abdomen: No acute abnormality. Musculoskeletal: There are no acute or destructive bony lesions. Reconstructed images demonstrate no additional  findings. Review of the MIP images confirms the above findings. IMPRESSION: 1. Acute segmental pulmonary emboli involving the right upper and left lower lobes. RV/LV ratio is elevated measuring 1.2, despite only mild clot bu

## 2021-04-08 NOTE — H&P (Addendum)
? ? ? ?Date: 04/09/2021     ?     ?     ?Patient Name:  John Schaefer MRN: [id number]  ?DOB: 03-14-1955 Age / Sex: 66 y.o., male   ?PCP: Vassie Moment, MD    ?     ?Medical Service: Internal Medicine Teaching Service    ?     ?Attending Physician: Dr. Heber Greasewood, Rachel Moulds, DO    ?First Contact: Dr. Allyson Sabal Pager: [phone]  ?Second Contact: Dr. Ileene Musa Pager: [phone]  ?     ?After Hours (After 5p/  First Contact Pager: [phone]  ?weekends / holidays): Second Contact Pager: [phone]  ? ?Chief Complaint: Shortness of breath ? ?History of Present Illness:  ?Patient is 66 year old male with past medical Hx of prostate cancer (09/2019) s/p prostatectomy and adjuvant radiation, hypertension and diabetes presenting to the ED with complaint of shortness of breath. Patient states about about one week ago he got a COVID booster, shingles, apneumonia shot and another shot from pharmacy. He became sluggish after the shots. Then patient developed a cough and then couple of days later the shortness of breath started. The shortness of breath worsened as time progressed. After 3-4 days of worsening shortness of breath, patient is presenting to the ED. ? ?Patient endorsed itchy eyes, runny nose and sneezing before getting the vaccines and these symptoms were present for one month before the vaccine. He bought Mucinex from the store about 2 weeks prior. Patient symptoms of cough and shortness of breath started after receiving the vaccine.  ? ?On ROS, patient endorsed decreased appetite, but no N/V/D/C, he denied chest pains, abdominal pain, joint or muscle pains.  ? ? ?Review of Systems: Negative unless stated in HPI. ? ?ED course: Patient presented with worsening dyspnea.  Initial sats in the 70s on room air placed on supplemental oxygen.  Labs and imaging performed (see below for details).  Imaging showed PE and heparin drip was started.  Given concern of pneumonia on CT antibiotics were given as well in the ED.  [facility]TS contacted for  admission. ? ?Past Medical History:  ?Diagnosis Date  ? Arthritis   ? Cancer [facility])   ? prostate  ? Hypertension   ? 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  ? ?Meds: Does not take tramadol. ?Current Outpatient Medications  ?Medication Instructions  ? amLODipine (NORVASC) 10 mg, Oral, Daily  ? atorvastatin (LIPITOR) 20 MG tablet TAKE 1 TABLET BY MOUTH ONCE DAILY  ? hydrochlorothiazide (HYDRODIURIL) 25 mg, Oral, Daily  ? losartan (COZAAR) 100 mg, Oral, Daily  ? metFORMIN (GLUCOPHAGE) 1,000 mg, Oral, 2 times daily with meals  ? sildenafil (REVATIO) 40 mg, Oral, Daily  ? traMADol (ULTRAM) 50-100 mg, Oral, Every 6 hours PRN  ?  ? ?Allergies: NKDA ?Allergies as of 04/08/2021  ? (No Known Allergies)  ? ? ?Family History:  ?Family History  ?Problem Relation Age of Onset  ? Heart disease Father 42  ?     MI  ? Diabetes Brother   ? Stroke Brother   ? Heart disease Paternal Aunt   ?Mom died from brain hemorrhage. ? ?Social History:  Patient lives alone. Endorses fair support system with limited friends. 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 Retired from [employer]. Quit smoking over 30 years ago, no alcohol use and very distant marijuana and cocaine use with last one over 30 years ago.   ?  Social History  ? ?Socioeconomic History  ? Marital status: Married  ?  Spouse name: Not on file  ? Number of children: Not on file  ? Years of education: Not on file  ? Highest education level: Not on file  ?Occupational History  ? 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  Comment: Part time  ?Tobacco Use  ? Smoking status: Former  ?  Packs/day: 0.50  ?  Years: 11.00  ?  Pack years: 5.50  ?  Types: Cigarettes  ? Smokeless tobacco: Never  ?Vaping Use  ? Vaping Use: Never used  ?Substance and Sexual Activity  ? Alcohol use: No  ?  Alcohol/week: 0.0 standard drinks  ? Drug use: No  ? Sexual activity: Yes  ?  Comment: 2 sexual partners in last 12 months  ?Other  Topics Concern  ? Not on file  ?Social History Narrative  ? Not on file  ? ?Social Determinants of Health  ? ?Financial Resource Strain: Not on file  ?Food Insecurity: Not on file  ?Transportation Needs: Not on file  ?Physical Activity: Not on file  ?Stress: Not on file  ?Social Connections: Not on file  ?Intimate Partner Violence: Not on file  ? ? ? ?Physical Exam: ?Blood pressure 101/64, pulse 78, temperature 98.7 ?F (37.1 ?C), temperature source Oral, resp. rate (!) 35, height '5\' 10"'$  (1.778 m), weight 106 kg, SpO2 91 %. ?General: NAD ?Head: Normocephalic, non-traumatic ?Eyes: PERRL, non-icteric, arcus senilis bilateral eyes ?Mouth and Throat: MMM ?Lungs: CTAB ?Cardiovascular: NSR, murmur present, 2+ pulses in all extremites ?Abdomen: No TTP, normal bowel sounds ?MSK: good grip, 5/5 strength in all extremities ?Skin: Kleiod on multiple sites including LUE,  and mid abdomen, poor feet care, thickened toe nails. Areas of hypopigmentation present diffusely but mostly on chest. ?Neuro: alert and oriented x4, CN grossly intact ?Psych: Normal mood and normal affect ? ?Diagnostics ?CBC shows mild leukocytosis. BMP shows hypokalemic at 3.2. Creatinine at 1.89. Hypocalcemia at 8.7. Glucose at 134. ? ? ?  Latest Ref Rng & Units 04/08/2021  ?  3:28 PM 10/07/2019  ?  4:58 AM 10/06/2019  ?  1:05 PM  ?CBC  ?WBC 4.0 - 10.5 K/uL 12.4      ?Hemoglobin 13.0 - 17.0 g/dL 13.0   11.9   13.9    ?Hematocrit 39.0 - 52.0 % 40.9   34.9   41.5    ?Platelets 150 - 400 K/uL 273      ?  ? ?  Latest Ref Rng & Units 04/08/2021  ?  4:42 PM 04/08/2021  ?  3:28 PM 09/30/2019  ? 11:46 AM  ?CMP  ?Glucose 70 - 99 mg/dL  134   105    ?BUN 8 - 23 mg/dL  21   13    ?Creatinine 0.61 - 1.24 mg/dL  1.89   1.27    ?Sodium 135 - 145 mmol/L  139   141    ?Potassium 3.5 - 5.1 mmol/L  3.2   3.7    ?Chloride 98 - 111 mmol/L  98   98    ?CO2 22 - 32 mmol/L  33   29    ?Calcium 8.9 - 10.3 mg/dL  8.7   9.8    ?Total Protein 6.5 - 8.1 g/dL 7.5      ?Total Bilirubin 0.3 -  1.2 mg/dL 0.7      ?Alkaline Phos 38 - 126 U/L 61      ?AST 15 - 41  U/L 24      ?ALT 0 - 44 U/L 26      ?  ?Troponin negative x2 ?BNP wnl ?Lactic Acid wnl ?Procalcitonin wnl ?CT Angio Chest PE W and/or Wo Contrast ? ?Result Date: 04/08/2021 ?CLINICAL DATA:  Shortness of breath for several days, prostate cancer EXAM: CT ANGIOGRAPHY CHEST WITH CONTRAST TECHNIQUE:  ? IMPRESSION: 1. Acute segmental pulmonary emboli involving the right upper and left lower lobes. RV/LV ratio is elevated measuring 1.2, despite only mild clot burden. Based on the similarity to prior examinations, this may be due to underlying chronic pulmonary hypertension rather than acute right heart strain. Please correlate with clinical presentation and patient status. 2. Dense areas of multifocal consolidation most pronounced in the right upper lobe and bilateral lower lobes. Favor multifocal pneumonia over pulmonary infarcts. 3.  Aortic Atherosclerosis (ICD10-I70.0). Critical Value/emergent results were called by telephone at the time of interpretation on 04/08/2021 at 7:17 pm to provider ADAM CURATOLO , who verbally acknowledged these results. Electronically Signed   By: Randa Ngo M.D.   On: 04/08/2021 19:21  ? ?DG Chest Port 1 View ? ?Result Date: 04/08/2021 ?CLINICAL DATA:  Shortness of breath for several days. EXAM: PORTABLE CHEST 1 VIEW COMPARISON:  None. FINDINGS: Low lung volumes are noted. Moderate severity bibasilar scarring and/or linear atelectasis is seen. Mild right perihilar atelectasis and/or infiltrate is also noted. There is no evidence of a pleural effusion or pneumothorax. The heart size and mediastinal contours are within normal limits. The visualized skeletal structures are unremarkable. IMPRESSION: Low lung volumes with mild right perihilar and moderate severity bibasilar scarring, atelectasis and/or infiltrate. Electronically Signed   By: Virgina Norfolk M.D.   On: 04/08/2021 17:17  ?  ? ?EKG: personally reviewed my  interpretation is sinus rhythm with prolonged QT interval. ? ?CXR: personally reviewed my interpretation is decreased inflation, with increased opacity in RLQ potentially atelectasis, no effusion, no broken ribs. ? ?[place]

## 2021-04-08 NOTE — ED Notes (Signed)
ED provider made aware of difficulty obtaining second peripheral IV needed for heparin and antibiotic infusions ordered (only 1 peripheral IV obtained at this time). Per ED provider, plan to infuse heparin at this time and infuse antibiotics once second IV has been placed. STAT IV team order placed.  ?

## 2021-04-08 NOTE — ED Notes (Signed)
Sandwich bag and ginger ale given to pt per diet order ?

## 2021-04-08 NOTE — ED Triage Notes (Signed)
Patient from home with worsening shortness of breath since yesterday. RA SpO2 at 78%. Pt placed on NRB. Pt states he started feeling worse after receiving COVID and PNA vaccines. Denies any chest pain, n/v/d, abd pain.  ?

## 2021-04-08 NOTE — ED Provider Triage Note (Addendum)
Emergency Medicine Provider Triage Evaluation Note ? ?John Schaefer , a 66 y.o. male  was evaluated in triage.  Pt complains of shortness of breath for several days.  He also has associated new cough.  He states the only other changes that he had a recent COVID and pneumonia vaccine.  He denies any chest pain, nausea, vomiting, abdominal pain, numbness, or tingling.  He denies history of blood clot, recent surgery, leg swelling. He does have hx of prostate cancer. Not on anticoagulation. ? ?Review of Systems  ?Positive: Shrotness of breath ?Negative:  ? ?Physical Exam  ?BP 120/73   Pulse 91   Temp 98.7 ?F (37.1 ?C) (Oral)   Resp 18   SpO2 100%  ?Gen:   Awake, no distress   ?Resp:  Normal effort  ?MSK:   Moves extremities without difficulty  ?Other:  No leg swelling, clear lungs. Dyspneic at rest ? ?He was hypoxic to 78 % in triage. Requiring nonrebreather mask. O2 sats improve to 100% ? ?Medical Decision Making  ?Medically screening exam initiated at 3:40 PM.  Appropriate orders placed.  John Schaefer was informed that the remainder of the evaluation will be completed by another provider, this initial triage assessment does not replace that evaluation, and the importance of remaining in the ED until their evaluation is complete. ? ?Doubt heart failure. No wheezing to suggest COPD v asthma. New cough so could be PNA. Also consideration of PE with new O2 requirement. Will need further eval in back.  ? ?  ?Adolphus Birchwood, PA-C ?04/08/21 1542 ? ?  ?Adolphus Birchwood, PA-C ?04/08/21 1544 ? ?

## 2021-04-08 NOTE — Progress Notes (Addendum)
ANTICOAGULATION CONSULT NOTE - Initial Consult ? ?Pharmacy Consult for heparin ?Indication: pulmonary embolus ? ?No Known Allergies ? ?Patient Measurements: ?Height: '5\' 10"'$  (177.8 cm) ?Weight: 106 kg (233 lb 11 oz) ?IBW/kg (Calculated) : 73 ?Heparin Dosing Weight: 95.7 kg ? ?Vital Signs: ?Temp: 98.7 ?F (37.1 ?C) (03/24 1533) ?Temp Source: Oral (03/24 1533) ?BP: 101/64 (03/24 1700) ?Pulse Rate: 78 (03/24 1700) ? ?Labs: ?Recent Labs  ?  04/08/21 ?1528 04/08/21 ?1642  ?HGB 13.0  --   ?HCT 40.9  --   ?PLT 273  --   ?CREATININE 1.89*  --   ?TROPONINIHS  --  19*  ? ? ?Estimated Creatinine Clearance: 47.5 mL/min (A) (by C-G formula based on SCr of 1.89 mg/dL (H)). ? ? ?Medical History: ?Past Medical History:  ?Diagnosis Date  ? Arthritis   ? Cancer [facility])   ? prostate  ? Hypertension   ? Stroke [facility])   ? ? ?Medications:  ?(Not in a hospital admission)  ?Scheduled:  ?Infusions:  ? azithromycin (ZITHROMAX) 500 MG IVPB (Vial-Mate [employer])    ? cefTRIAXone (ROCEPHIN)  IV    ? ? ?Assessment: ?Patient presenting with shortness of breath found to have acute subsegmental PE with RV/LV ratio of 1.2, but mild clot burden. No anticoagulation noted PTA. CBC is stable. Patient is requiring supplemental oxygen. ? ?Goal of Therapy:  ?Heparin level 0.3-0.7 units/ml ?Monitor platelets by anticoagulation protocol: Yes ?  ?Plan:  ?Bolus with 6000 units ?Start 1600 units/hr of heparin ?6 hour heparin level ?Daily heparin level/CBC ? ?Thank you for allowing pharmacy to participate in this patient's care. ? ?Reatha Harps, PharmD ?PGY1 Pharmacy Resident ?04/08/2021 7:41 PM ?Check [email or web address] for unit specific pharmacy number ? ? ? ?

## 2021-04-09 ENCOUNTER — Observation Stay (HOSPITAL_COMMUNITY): Payer: Medicare Other

## 2021-04-09 ENCOUNTER — Encounter (HOSPITAL_COMMUNITY): Payer: Self-pay | Admitting: Internal Medicine

## 2021-04-09 DIAGNOSIS — I2609 Other pulmonary embolism with acute cor pulmonale: Secondary | ICD-10-CM

## 2021-04-09 DIAGNOSIS — N179 Acute kidney failure, unspecified: Secondary | ICD-10-CM | POA: Diagnosis not present

## 2021-04-09 DIAGNOSIS — I2699 Other pulmonary embolism without acute cor pulmonale: Secondary | ICD-10-CM | POA: Diagnosis not present

## 2021-04-09 LAB — CBC WITH DIFFERENTIAL/PLATELET
Abs Immature Granulocytes: 0.03 10*3/uL (ref 0.00–0.07)
Basophils Absolute: 0.1 10*3/uL (ref 0.0–0.1)
Basophils Relative: 1 %
Eosinophils Absolute: 0.1 10*3/uL (ref 0.0–0.5)
Eosinophils Relative: 1 %
HCT: 39.8 % (ref 39.0–52.0)
Hemoglobin: 12.6 g/dL — ABNORMAL LOW (ref 13.0–17.0)
Immature Granulocytes: 0 %
Lymphocytes Relative: 9 %
Lymphs Abs: 1 10*3/uL (ref 0.7–4.0)
MCH: 28.1 pg (ref 26.0–34.0)
MCHC: 31.7 g/dL (ref 30.0–36.0)
MCV: 88.8 fL (ref 80.0–100.0)
Monocytes Absolute: 0.8 10*3/uL (ref 0.1–1.0)
Monocytes Relative: 8 %
Neutro Abs: 8.7 10*3/uL — ABNORMAL HIGH (ref 1.7–7.7)
Neutrophils Relative %: 81 %
Platelets: 293 10*3/uL (ref 150–400)
RBC: 4.48 MIL/uL (ref 4.22–5.81)
RDW: 14 % (ref 11.5–15.5)
WBC: 10.7 10*3/uL — ABNORMAL HIGH (ref 4.0–10.5)
nRBC: 0 % (ref 0.0–0.2)

## 2021-04-09 LAB — HEPARIN LEVEL (UNFRACTIONATED)
Heparin Unfractionated: >1.1 IU/mL — ABNORMAL HIGH (ref 0.30–0.70)
Heparin Unfractionated: 0.77 IU/mL — ABNORMAL HIGH (ref 0.30–0.70)

## 2021-04-09 LAB — PROCALCITONIN: Procalcitonin: <0.1 ng/mL

## 2021-04-09 LAB — HEMOGLOBIN A1C
Hgb A1c MFr Bld: 6.6 % — ABNORMAL HIGH (ref 4.8–5.6)
Mean Plasma Glucose: 142.72 mg/dL

## 2021-04-09 LAB — BASIC METABOLIC PANEL
Anion gap: 8 (ref 5–15)
BUN: 21 mg/dL (ref 8–23)
CO2: 33 mmol/L — ABNORMAL HIGH (ref 22–32)
Calcium: 8.5 mg/dL — ABNORMAL LOW (ref 8.9–10.3)
Chloride: 101 mmol/L (ref 98–111)
Creatinine, Ser: 2 mg/dL — ABNORMAL HIGH (ref 0.61–1.24)
GFR, Estimated: 36 mL/min — ABNORMAL LOW (ref >60)
Glucose, Bld: 101 mg/dL — ABNORMAL HIGH (ref 70–99)
Potassium: 3.8 mmol/L (ref 3.5–5.1)
Sodium: 142 mmol/L (ref 135–145)

## 2021-04-09 LAB — ECHOCARDIOGRAM COMPLETE
AR max vel: 2.07 cm2
AV Area VTI: 2.56 cm2
AV Area mean vel: 2.04 cm2
AV Mean grad: 9 mmHg
AV Peak grad: 16.9 mmHg
Ao pk vel: 2.05 m/s
Area-P 1/2: 2.82 cm2
Height: 70 in
MV VTI: 2.84 cm2
S' Lateral: 2.8 cm
Weight: 3739 oz

## 2021-04-09 LAB — HIV ANTIBODY (ROUTINE TESTING W REFLEX): HIV Screen 4th Generation wRfx: NONREACTIVE

## 2021-04-09 LAB — LIPID PANEL
Cholesterol: 129 mg/dL (ref 0–200)
HDL: 36 mg/dL — ABNORMAL LOW (ref >40)
LDL Cholesterol: 81 mg/dL (ref 0–99)
Total CHOL/HDL Ratio: 3.6 RATIO
Triglycerides: 60 mg/dL (ref <150)
VLDL: 12 mg/dL (ref 0–40)

## 2021-04-09 LAB — GLUCOSE, CAPILLARY: Glucose-Capillary: 92 mg/dL (ref 70–99)

## 2021-04-09 MED ORDER — POTASSIUM CHLORIDE CRYS ER 20 MEQ PO TBCR
40.0000 meq | EXTENDED_RELEASE_TABLET | Freq: Two times a day (BID) | ORAL | Status: AC
  Administered 2021-04-09 – 2021-04-10 (×3): 40 meq via ORAL
  Filled 2021-04-09 (×4): qty 2

## 2021-04-09 MED ORDER — PERFLUTREN LIPID MICROSPHERE
1.0000 mL | INTRAVENOUS | Status: AC | PRN
  Administered 2021-04-09: 2 mL via INTRAVENOUS
  Filled 2021-04-09: qty 10

## 2021-04-09 NOTE — Progress Notes (Addendum)
? ? ?HD#0 ?Subjective:  ?Overnight Events: NAEO ? ? He feels good today, feels like breathing has improved. Knows that he has a clot in his lungs. States he has never had this before. Discussed plan going forward. ? ?Objective:  ?Vital signs in last 24 hours: ?Vitals:  ? 04/09/21 0030 04/09/21 0130 04/09/21 0311 04/09/21 0803  ?BP: 111/66 102/65 103/70 116/69  ?Pulse: 71 74 84 84  ?Resp: 19 (!) '24 18 16  '$ ?Temp:   99.4 ?F (37.4 ?C) 97.8 ?F (36.6 ?C)  ?TempSrc:   Oral Oral  ?SpO2: 93% 95% 96% 94%  ?Weight:      ?Height:      ? ?Supplemental O2: Nasal Cannula ?SpO2: 94 % ?O2 Flow Rate (L/min): 15 L/min ? ? ?Physical Exam:  ?Constitutional: Obese elderly man resting comfortably in bed in no acute distress ?HEENT: normocephalic atraumatic, mucous membranes moist , conjunctiva non-erythematous ?Cardiovascular: regular rate and rhythm, no m/r/g ?Pulmonary/Chest: normal work of 4.5L [place], mild Bibasilar crackles ?Abdominal: soft, non-tender, distended ?MSK: normal bulk and tone ?Neurological: alert & oriented x 3, answering questions appropriately ?Skin: warm and dry ?Psych: normal affect ? ?Filed Weights  ? 04/08/21 1540  ?Weight: 106 kg  ? ? ? ?Intake/Output Summary (Last 24 hours) at 04/09/2021 1146 ?Last data filed at 04/09/2021 0315 ?Gross per 24 hour  ?Intake 590 ml  ?Output --  ?Net 590 ml  ? ?Net IO Since Admission: 590 mL [04/09/21 1146] ? ?Pertinent Labs: ? ?  Latest Ref Rng & Units 04/09/2021  ?  5:21 AM 04/08/2021  ?  3:28 PM 10/07/2019  ?  4:58 AM  ?CBC  ?WBC 4.0 - 10.5 K/uL 10.7   12.4     ?Hemoglobin 13.0 - 17.0 g/dL 12.6   13.0   11.9    ?Hematocrit 39.0 - 52.0 % 39.8   40.9   34.9    ?Platelets 150 - 400 K/uL 293   273     ? ? ? ?  Latest Ref Rng & Units 04/09/2021  ?  5:21 AM 04/08/2021  ?  4:42 PM 04/08/2021  ?  3:28 PM  ?CMP  ?Glucose 70 - 99 mg/dL 101    134    ?BUN 8 - 23 mg/dL 21    21    ?Creatinine 0.61 - 1.24 mg/dL 2.00    1.89    ?Sodium 135 - 145 mmol/L 142    139    ?Potassium 3.5 - 5.1 mmol/L 3.8    3.2     ?Chloride 98 - 111 mmol/L 101    98    ?CO2 22 - 32 mmol/L 33    33    ?Calcium 8.9 - 10.3 mg/dL 8.5    8.7    ?Total Protein 6.5 - 8.1 g/dL  7.5     ?Total Bilirubin 0.3 - 1.2 mg/dL  0.7     ?Alkaline Phos 38 - 126 U/L  61     ?AST 15 - 41 U/L  24     ?ALT 0 - 44 U/L  26     ? ? ?Imaging: ?CT Angio Chest PE W and/or Wo Contrast ? ?Result Date: 04/08/2021 ?CLINICAL DATA:  Shortness of breath for several days, prostate cancer EXAM: CT ANGIOGRAPHY CHEST WITH CONTRAST TECHNIQUE: Multidetector CT imaging of the chest was performed using the standard protocol during bolus administration of intravenous contrast. Multiplanar CT image reconstructions and MIPs were obtained to evaluate the vascular anatomy. RADIATION DOSE REDUCTION: This  exam was performed according to the departmental dose-optimization program which includes automated exposure control, adjustment of the mA and/or kV according to patient size and/or use of iterative reconstruction technique. CONTRAST:  17m OMNIPAQUE IOHEXOL 350 MG/ML SOLN COMPARISON:  04/08/2021, 03/30/2020 FINDINGS: Cardiovascular: This is a technically adequate evaluation of the pulmonary vasculature. There are bilateral segmental pulmonary emboli most pronounced within the right upper and left lower lobes. Equivocal segmental emboli within the right lower lobe, evaluation limited by respiratory motion and adjacent lung consolidation. There is mild clot burden. No evidence of central pulmonary emboli. The RV/LV ratio is calculated at 1.2, though there is no straightening or bowing of the interventricular septum and the configuration of the cardiac chambers appear similar to prior abdominal CT 04/15/2019. This may be related to chronic right ventricular dilatation due to pulmonary arterial hypertension. No evidence of pericardial effusion. Ectasia of the thoracic aorta without aneurysm or dissection. Mild atherosclerosis of the aortic arch and coronary vasculature. Mediastinum/Nodes: No  enlarged mediastinal, hilar, or axillary lymph nodes. Thyroid gland, trachea, and esophagus demonstrate no significant findings. Lungs/Pleura: Multifocal areas of dense consolidation are identified, greatest in the right upper and bilateral lower lobes. The appearance is most consistent with multifocal pneumonia, though underlying pulmonary infarct cannot be excluded in light of pulmonary emboli. No effusion or pneumothorax. Central airways are patent. Upper Abdomen: No acute abnormality. Musculoskeletal: There are no acute or destructive bony lesions. Reconstructed images demonstrate no additional findings. Review of the MIP images confirms the above findings. IMPRESSION: 1. Acute segmental pulmonary emboli involving the right upper and left lower lobes. RV/LV ratio is elevated measuring 1.2, despite only mild clot burden. Based on the similarity to prior examinations, this may be due to underlying chronic pulmonary hypertension rather than acute right heart strain. Please correlate with clinical presentation and patient status. 2. Dense areas of multifocal consolidation most pronounced in the right upper lobe and bilateral lower lobes. Favor multifocal pneumonia over pulmonary infarcts. 3.  Aortic Atherosclerosis (ICD10-I70.0). Critical Value/emergent results were called by telephone at the time of interpretation on 04/08/2021 at 7:17 pm to provider ADAM CURATOLO , who verbally acknowledged these results. Electronically Signed   By: MRanda NgoM.D.   On: 04/08/2021 19:21  ? ?DG Chest Port 1 View ? ?Result Date: 04/08/2021 ?CLINICAL DATA:  Shortness of breath for several days. EXAM: PORTABLE CHEST 1 VIEW COMPARISON:  None. FINDINGS: Low lung volumes are noted. Moderate severity bibasilar scarring and/or linear atelectasis is seen. Mild right perihilar atelectasis and/or infiltrate is also noted. There is no evidence of a pleural effusion or pneumothorax. The heart size and mediastinal contours are within normal  limits. The visualized skeletal structures are unremarkable. IMPRESSION: Low lung volumes with mild right perihilar and moderate severity bibasilar scarring, atelectasis and/or infiltrate. Electronically Signed   By: TVirgina NorfolkM.D.   On: 04/08/2021 17:17   ? ?Assessment/Plan:  ? ?Principal Problem: ?  Pulmonary embolism ([facility] ?Active Problems: ?  Prostate cancer ([facility] ?  Acute pulmonary embolism ([facility] ?  AKI (acute kidney injury) ([facility] ?  CAP (community acquired pneumonia) ?  Hypokalemia ? ? ?Patient Summary: ?WDaveyon KitchingsWard is a 66y.o. with a pertinent PMH of prostate cancer s/p prostatectomy and adjuvant radiation therapy, hx of obesity, htn, and DMII and concern for pulmonary htn,  presenting to the ED with acute dyspnea and found to have acute pulmonary embolism. ?  ?#Acute hypoxic respiratory failure ?#Acute Unprovoked Submassive Pulmonary Embolism ?Patient is breathing well  between 4-5 L of oxygen however he might actually be able to wean down from that as he was not hooked up to pulse ox while we were in the room. Due to question of acute vs chronic nature of RH strain will repeat echo today. Think that his infiltrates on imaging are likely related to the PE rather than a concurrent infection. Will hold off on further antibiotics for today. Will continue heparin until echo is read with plans to transition to [place]. ?-Continue heparin gtt ?-Plan to transition patient to [place] ?-Obtain Echo to better characterize right heart strain ?-Continue telemetry ?-Continuous pulse ox.  ?-wean oxygen as able ?  ?#Acute Kidney Injury ?Could be related to IV contrast patient received on admission. Creatinine is up to 2 today. ?-Avoid nephrotoxins  ?-daily BMP ?  ?#Hypertension ?Patient has hx of hypertension. Home meds include Norvasc 10 mg qd, HCTZ 25 mg qd, and losartan 100 mg daily. Patient currently normotensive so we will hold his antihypertensives.  ?- continue to hold ?  ?#Diabetes Mellitus II ?A1c this  admission at 101. His home medications is Metformin 1000 mg BID.  ?-SSI with meals and at bedtime ?  ?#Hyperlipidemia ?-Continue home lipitor 20 mg daily ? ?  ?#Hypokalemia; resolved ? ?DVT prophx: Heparin g

## 2021-04-09 NOTE — Care Management Obs Status (Signed)
MEDICARE OBSERVATION STATUS NOTIFICATION ? ? ?Patient Details  ?Name: John Schaefer ?MRN: [id number] ?Date of Birth: 1955-02-25 ? ? ?Medicare Observation Status Notification Given:  Yes ? ? ? ?Bartholomew Crews, RN ?04/09/2021, 3:31 PM ?

## 2021-04-09 NOTE — Progress Notes (Signed)
ANTICOAGULATION CONSULT NOTE - Follow Up Consult ? ?Pharmacy Consult for Heparin infusion ?Indication: pulmonary embolus ? ?No Known Allergies ? ?Patient Measurements: ?Height: '5\' 10"'$  (177.8 cm) ?Weight: 106 kg (233 lb 11 oz) ?IBW/kg (Calculated) : 73 ?Heparin Dosing Weight: 95.7 kg ? ?Vital Signs: ?Temp: 98 ?F (36.7 ?C) (03/25 1448) ?Temp Source: Oral (03/25 1448) ?BP: 104/58 (03/25 1448) ?Pulse Rate: 79 (03/25 1448) ? ?Labs: ?Recent Labs  ?  04/08/21 ?1528 04/08/21 ?1642 04/08/21 ?1921 04/09/21 ?1751 04/09/21 ?0258 04/09/21 ?1545  ?HGB 13.0  --   --  12.6*  --   --   ?HCT 40.9  --   --  39.8  --   --   ?PLT 273  --   --  293  --   --   ?HEPARINUNFRC  --   --   --   --  >1.10* 0.77*  ?CREATININE 1.89*  --   --  2.00*  --   --   ?TROPONINIHS  --  19* 18*  --   --   --   ? ? ? ?Estimated Creatinine Clearance: 44.9 mL/min (A) (by C-G formula based on SCr of 2 mg/dL (H)). ? ? ? ?Assessment: ?66 yo M presenting with shortness of breath found to have acute subsegmental PE with RV/LV ratio of 1.2, but mild clot burden. No anticoagulation noted PTA. Pharmacy consulted to manage heparin infusion. ? ?Heparin now 0.77 ? ?Goal of Therapy:  ?Heparin level 0.3-0.7 units/ml ?Monitor platelets by anticoagulation protocol: Yes ?  ?Plan:  ?Decrease heparin to 1250 units / hr ?Monitor CBC and heparin level daily ?Continue to monitor for s/sx of bleeding ? ?Thank you ?Anette Guarneri, PharmD ?04/09/2021,4:22 PM ? ? ?

## 2021-04-09 NOTE — Hospital Course (Addendum)
Patient not moving around much, but doing well with breathing at rest.  ? ?Cough stable. Denies subjective fever and chills overnight. Appetite is returning. Reports slight headache but denies other pain. ? ?No BM since admission. No problems with urination.  ?

## 2021-04-09 NOTE — Progress Notes (Signed)
ANTICOAGULATION CONSULT NOTE - Follow Up Consult ? ?Pharmacy Consult for Heparin infusion ?Indication: pulmonary embolus ? ?No Known Allergies ? ?Patient Measurements: ?Height: '5\' 10"'$  (177.8 cm) ?Weight: 106 kg (233 lb 11 oz) ?IBW/kg (Calculated) : 73 ?Heparin Dosing Weight: 95.7 kg ? ?Vital Signs: ?Temp: 97.8 ?F (36.6 ?C) (03/25 7948) ?Temp Source: Oral (03/25 0165) ?BP: 116/69 (03/25 0803) ?Pulse Rate: 84 (03/25 0803) ? ?Labs: ?Recent Labs  ?  04/08/21 ?1528 04/08/21 ?1642 04/08/21 ?1921 04/09/21 ?5374 04/09/21 ?8270  ?HGB 13.0  --   --  12.6*  --   ?HCT 40.9  --   --  39.8  --   ?PLT 273  --   --  293  --   ?HEPARINUNFRC  --   --   --   --  >1.10*  ?CREATININE 1.89*  --   --  2.00*  --   ?TROPONINIHS  --  19* 18*  --   --   ? ? ?Estimated Creatinine Clearance: 44.9 mL/min (A) (by C-G formula based on SCr of 2 mg/dL (H)). ? ? ?Medications:  ?Medications Prior to Admission  ?Medication Sig Dispense Refill Last Dose  ? amLODipine (NORVASC) 10 MG tablet Take 1 tablet (10 mg total) by mouth daily. 90 tablet 3   ? atorvastatin (LIPITOR) 20 MG tablet TAKE 1 TABLET BY MOUTH ONCE DAILY (Patient taking differently: Take 20 mg by mouth daily.) 30 tablet 0   ? hydrochlorothiazide (HYDRODIURIL) 25 MG tablet Take 25 mg by mouth daily.     ? losartan (COZAAR) 100 MG tablet Take 100 mg by mouth daily.     ? metFORMIN (GLUCOPHAGE) 1000 MG tablet Take 1,000 mg by mouth 2 (two) times daily with a meal.     ? sildenafil (REVATIO) 20 MG tablet Take 40 mg by mouth daily.     ? traMADol (ULTRAM) 50 MG tablet Take 1-2 tablets (50-100 mg total) by mouth every 6 (six) hours as needed for moderate pain or severe pain. (Patient not taking: Reported on 08/31/2020) 20 tablet 0   ? ? ?Assessment: ?66 yo M presenting with shortness of breath found to have acute subsegmental PE with RV/LV ratio of 1.2, but mild clot burden. No anticoagulation noted PTA. Pharmacy consulted to manage heparin infusion. ? ?H/H/Plt stable ?Heparin level >1.1,  supratherapeutic ?Current heparin infusion rate: 1600 units/hr ?No s/sx of bleeding, per RN ? ?Goal of Therapy:  ?Heparin level 0.3-0.7 units/ml ?Monitor platelets by anticoagulation protocol: Yes ?  ?Plan:  ?HOLD heparin infusion x 1 hr ?Resume heparin infusion at reduced rate of 1400 units/hr.  ?RN notified of changes ?Heparin level 6 hrs after heparin restarted ?Monitor CBC and heparin level daily ?Continue to monitor for s/sx of bleeding ? ?Kaleen Mask ?04/09/2021,8:47 AM ? ? ?

## 2021-04-09 NOTE — TOC Progression Note (Signed)
Transition of Care (TOC) - Progression Note  ? ? ?Patient Details  ?Name: John Schaefer ?MRN: [id number] ?Date of Birth: [birth year] ? ?Transition of Care (TOC) CM/SW Contact  ?Bartholomew Crews, RN ?Phone Number: [phone] ?04/09/2021, 11:33 AM ? ?Clinical Narrative:    ? ?Acknowledging TOC consult for benefits check for apixaban and rivaroxaban. Benefits checks are done Monday thru Friday when insurance and pharmacy companies are available.  ? ?  ?  ? ?Expected Discharge Plan and Services ?  ?  ?  ?  ?  ?                ?  ?  ?  ?  ?  ?  ?  ?  ?  ?  ? ? ?Social Determinants of Health (SDOH) Interventions ?  ? ?Readmission Risk Interventions ?   ? View : No data to display.  ?  ?  ?  ? ? ?

## 2021-04-10 DIAGNOSIS — E785 Hyperlipidemia, unspecified: Secondary | ICD-10-CM | POA: Diagnosis present

## 2021-04-10 DIAGNOSIS — Z79899 Other long term (current) drug therapy: Secondary | ICD-10-CM | POA: Diagnosis not present

## 2021-04-10 DIAGNOSIS — I2694 Multiple subsegmental pulmonary emboli without acute cor pulmonale: Secondary | ICD-10-CM | POA: Diagnosis not present

## 2021-04-10 DIAGNOSIS — Z9079 Acquired absence of other genital organ(s): Secondary | ICD-10-CM | POA: Diagnosis not present

## 2021-04-10 DIAGNOSIS — Z8546 Personal history of malignant neoplasm of prostate: Secondary | ICD-10-CM | POA: Diagnosis not present

## 2021-04-10 DIAGNOSIS — C61 Malignant neoplasm of prostate: Secondary | ICD-10-CM | POA: Diagnosis present

## 2021-04-10 DIAGNOSIS — Z7984 Long term (current) use of oral hypoglycemic drugs: Secondary | ICD-10-CM | POA: Diagnosis not present

## 2021-04-10 DIAGNOSIS — I2699 Other pulmonary embolism without acute cor pulmonale: Secondary | ICD-10-CM | POA: Diagnosis present

## 2021-04-10 DIAGNOSIS — I10 Essential (primary) hypertension: Secondary | ICD-10-CM | POA: Diagnosis present

## 2021-04-10 DIAGNOSIS — Z8673 Personal history of transient ischemic attack (TIA), and cerebral infarction without residual deficits: Secondary | ICD-10-CM | POA: Diagnosis not present

## 2021-04-10 DIAGNOSIS — N179 Acute kidney failure, unspecified: Secondary | ICD-10-CM | POA: Diagnosis present

## 2021-04-10 DIAGNOSIS — Z833 Family history of diabetes mellitus: Secondary | ICD-10-CM | POA: Diagnosis not present

## 2021-04-10 DIAGNOSIS — Z20822 Contact with and (suspected) exposure to covid-19: Secondary | ICD-10-CM | POA: Diagnosis present

## 2021-04-10 DIAGNOSIS — Z8249 Family history of ischemic heart disease and other diseases of the circulatory system: Secondary | ICD-10-CM | POA: Diagnosis not present

## 2021-04-10 DIAGNOSIS — E119 Type 2 diabetes mellitus without complications: Secondary | ICD-10-CM | POA: Diagnosis present

## 2021-04-10 DIAGNOSIS — Z6833 Body mass index (BMI) 33.0-33.9, adult: Secondary | ICD-10-CM | POA: Diagnosis not present

## 2021-04-10 DIAGNOSIS — Z923 Personal history of irradiation: Secondary | ICD-10-CM | POA: Diagnosis not present

## 2021-04-10 DIAGNOSIS — E876 Hypokalemia: Secondary | ICD-10-CM | POA: Diagnosis present

## 2021-04-10 DIAGNOSIS — J9601 Acute respiratory failure with hypoxia: Secondary | ICD-10-CM | POA: Diagnosis not present

## 2021-04-10 DIAGNOSIS — E669 Obesity, unspecified: Secondary | ICD-10-CM | POA: Diagnosis present

## 2021-04-10 LAB — BASIC METABOLIC PANEL
Anion gap: 8 (ref 5–15)
BUN: 15 mg/dL (ref 8–23)
CO2: 35 mmol/L — ABNORMAL HIGH (ref 22–32)
Calcium: 8.5 mg/dL — ABNORMAL LOW (ref 8.9–10.3)
Chloride: 97 mmol/L — ABNORMAL LOW (ref 98–111)
Creatinine, Ser: 1.57 mg/dL — ABNORMAL HIGH (ref 0.61–1.24)
GFR, Estimated: 49 mL/min — ABNORMAL LOW (ref >60)
Glucose, Bld: 107 mg/dL — ABNORMAL HIGH (ref 70–99)
Potassium: 4.3 mmol/L (ref 3.5–5.1)
Sodium: 140 mmol/L (ref 135–145)

## 2021-04-10 LAB — CBC
HCT: 39.5 % (ref 39.0–52.0)
Hemoglobin: 12.4 g/dL — ABNORMAL LOW (ref 13.0–17.0)
MCH: 28.4 pg (ref 26.0–34.0)
MCHC: 31.4 g/dL (ref 30.0–36.0)
MCV: 90.4 fL (ref 80.0–100.0)
Platelets: 320 10*3/uL (ref 150–400)
RBC: 4.37 MIL/uL (ref 4.22–5.81)
RDW: 13.8 % (ref 11.5–15.5)
WBC: 10.5 10*3/uL (ref 4.0–10.5)
nRBC: 0 % (ref 0.0–0.2)

## 2021-04-10 LAB — HEPARIN LEVEL (UNFRACTIONATED): Heparin Unfractionated: 0.49 IU/mL (ref 0.30–0.70)

## 2021-04-10 MED ORDER — APIXABAN 5 MG PO TABS
10.0000 mg | ORAL_TABLET | Freq: Two times a day (BID) | ORAL | Status: DC
  Administered 2021-04-10 – 2021-04-11 (×4): 10 mg via ORAL
  Filled 2021-04-10 (×4): qty 2

## 2021-04-10 MED ORDER — APIXABAN 5 MG PO TABS: 5.0000 mg | ORAL_TABLET | Freq: Two times a day (BID) | ORAL | Status: DC

## 2021-04-10 MED ORDER — APIXABAN (ELIQUIS) EDUCATION KIT FOR DVT/PE PATIENTS
PACK | Freq: Once | Status: DC
  Filled 2021-04-10: qty 1

## 2021-04-10 MED ORDER — POLYETHYLENE GLYCOL 3350 17 G PO PACK: 17.0000 g | PACK | Freq: Every day | ORAL | Status: DC | PRN

## 2021-04-10 NOTE — Progress Notes (Addendum)
? ?Subjective: ? ?Patient not moving around much, but doing well with breathing at rest. Currently with no SOB. ? ?Cough stable. Denies subjective fever and chills overnight. Appetite is returning. Reports slight headache but denies other pain. ? ?No BM since admission, has been attempting to increase water intake to try to have BM. No problems with urination.  ? ?Objective: ? ?Vital signs in last 24 hours: ?Vitals:  ? 04/09/21 1448 04/09/21 1937 04/10/21 0504 04/10/21 0735  ?BP: (!) 104/58 110/74 (!) 118/54 116/69  ?Pulse: 79 83 72 89  ?Resp: '20 20  18  '$ ?Temp: 98 ?F (36.7 ?C) 98.2 ?F (36.8 ?C) 99.2 ?F (37.3 ?C) 99.9 ?F (37.7 ?C)  ?TempSrc: Oral Oral Oral Oral  ?SpO2: 94% 94% (!) 88% 95%  ?Weight:      ?Height:      ? ?Weight change:  ? ?Intake/Output Summary (Last 24 hours) at 04/10/2021 0929 ?Last data filed at 04/09/2021 2100 ?Gross John 24 hour  ?Intake 337.01 ml  ?Output 500 ml  ?Net -162.99 ml  ? ?Physical Exam: ?Constitutional: elderly man sitting comfortably in bed, conversant, in NAD ?HEENT: normocephalic, atraumatic, moist mucus membranes ?Cardio: normal rate and rhythm ?Pulm: no increased work of breathing on 2.5L [place], mild bibasilar crackles ?Abd: non-distended, non-tender ?Extremities: warm and dry, capillary refill <3sec, normal skin turgor to bilateral LE ?Neuro: alert and oriented x3, answering questions appropriately ?Psych: normal affect ? ?Assessment/Plan: ? ?Principal Problem: ?  Pulmonary embolism ([place]) ?Active Problems: ?  Prostate cancer ([place]) ?  Acute pulmonary embolism ([place]) ?  AKI (acute kidney injury) ([place]) ?  CAP (community acquired pneumonia) ?  Hypokalemia ? ?Patient Summary: ?John Schaefer is a 66 y.o. with a pertinent PMH of prostate cancer s/p prostatectomy and adjuvant radiation therapy, hx of obesity, htn, and DMII and concern for pulmonary htn, with improving acute dyspnea secondary to acute pulmonary embolism. ?  ?#Acute hypoxic respiratory failure ?#Acute Unprovoked Submassive  Pulmonary Embolism ?Patient weaned from 4.5L to 2.5L O2 [place] while in room today, O2 sats remained at ~90%. No SOB with decrease. Echo yesterday showed mildly elevated pulmonary artery systolic pressure, not specifically commented on but with normal RV systolic function on last echo. Suspect acute vs chronic nature of RH strain 2/2 to PE. His PESI score was calculated today placing him in class III, intermediate risk with 3.2-7.1% 30-day mortality. Patient has not been able to ambulate much since admission, would like to see what O2 sats are with ambulation. Will consult PT/OT today. Plan to transition to [place] from heparin gtt today. Patient remains afebrile with no acute signs of infection. Continue to think that infiltrates on imaging are likely related to the PE vs concurrent infection.  ?-continue heparin gtt with plan to transition patient to [place] today: Eliquis 10 mg BID x 7 days followed by Eliquis 5 mg BID. Heparin gtt to d/c with first dose of Eliquis. ?-continue telemetry ?-continuous pulse ox.  ?-wean oxygen as able ?-consult PT/OT ?  ?#Acute Kidney Injury ?Could be related to IV contrast patient received on admission. Kidney function improved on labs today, creatinine down to 1.57 from 2 (3/25). He is euvolemic on exam and has good urinary output. ?-avoid nephrotoxins  ?-daily BMP ?  ?#Hypertension ?Patient has hx of hypertension. Home meds include Norvasc 10 mg qd, HCTZ 25 mg qd, and losartan 100 mg daily. Patient currently normotensive so we will continue to hold his antihypertensives.  ?- continue to hold ?  ?#Diabetes Mellitus II ?  A1c this admission at 6.6. His home medication is Metformin 1000 mg BID.  ?-SSI with meals and at bedtime ?  ?#Hyperlipidemia ?-Continue home lipitor 20 mg daily ?  ?#Hypokalemia; resolved ?  ?DVT prophx: Heparin gtt ?Diet:Heart healthy ?Bowel:PRN ?Code:Full ? ?John Schaefer, John Schaefer, Medical Student ?04/10/2021, 9:29 AM  ? ?Attestation for Student Documentation: ? ?I  personally was present and performed or re-performed the history, physical exam and medical decision-making activities of this service and have verified that the service and findings are accurately documented in the student?s note. ? Rosezetta Schlatter, MD ?04/10/2021, 12:21 PM  ?

## 2021-04-10 NOTE — Evaluation (Signed)
Physical Therapy Evaluation ?Patient Details ?Name: John Schaefer ?MRN: [id number] ?DOB: 09-29-1955 ?Today's Date: 04/10/2021 ? ?History of Present Illness ? Pt is a 66 y/o male admitted secondary to SOB and found to have an acute PE. Heparin was initiated and his levels have been within therapeutic range. PMH including but not limited to prostate cancer (09/2019) s/p prostatectomy and adjuvant radiation, hypertension and diabetes. ?  ?Clinical Impression ? Pt presented supine in bed with HOB elevated, awake and willing to participate in therapy session. Prior to admission, pt reported that he was independent with all functional mobility and ADLs. He stated that he lives alone in a second floor apartment with a full flight of stairs to enter. At the time of evaluation, pt overall limited secondary to fatigue, generalized weakness and poor respiratory tolerance to activity. Pt initially on 3L of O2 upon PT arrival; however, the [place] was not appropriately in his nostrils and his SpO2 was at 91% at rest. With activity, pt's SpO2 decreasing to as low as 85% on RA. PT reapplied pt's [place] and his SpO2 increased to 93% at rest on 3L. Pt would continue to benefit from skilled physical therapy services at this time while admitted and after d/c to address the below listed limitations in order to improve overall safety and independence with functional mobility. ?   ?   ? ?Recommendations for follow up therapy are one component of a multi-disciplinary discharge planning process, led by the attending physician.  Recommendations may be updated based on patient status, additional functional criteria and insurance authorization. ? ?Follow Up Recommendations Skilled nursing-short term rehab (<3 hours/day) *Unless pt can have family/caregivers provide 24/7 supervision assistance initially upon d/c ? ?  ?Assistance Recommended at Discharge Frequent or constant Supervision/Assistance  ?Patient can return home with the following ? A lot of help  with walking and/or transfers;A lot of help with bathing/dressing/bathroom;Help with stairs or ramp for entrance ? ?  ?Equipment Recommendations Rolling walker (2 wheels);BSC/3in1  ?Recommendations for Other Services ?    ?  ?Functional Status Assessment Patient has had a recent decline in their functional status and demonstrates the ability to make significant improvements in function in a reasonable and predictable amount of time.  ? ?  ?Precautions / Restrictions Precautions ?Precautions: Fall ?Restrictions ?Weight Bearing Restrictions: No  ? ?  ? ?Mobility ? Bed Mobility ?Overal bed mobility: Needs Assistance ?Bed Mobility: Supine to Sit ?  ?  ?Supine to sit: Min guard ?  ?  ?General bed mobility comments: HOB elevated, use of bed rails, increased time and effort needed, VC'ing for sequencing to complete task ?  ? ?Transfers ?Overall transfer level: Needs assistance ?Equipment used: Rolling walker (2 wheels) ?Transfers: Sit to/from Stand ?Sit to Stand: Min guard ?  ?  ?  ?  ?  ?General transfer comment: increased time and effort, good technique utilized, min guard for safety with transitional movement ?  ? ?Ambulation/Gait ?Ambulation/Gait assistance: Min guard ?  ?Assistive device: Rolling walker (2 wheels) ?Gait Pattern/deviations: Decreased stride length ?Gait velocity: decreased ?  ?  ?General Gait Details: pt able to take 2 steps forwards, 2 steps backwards and 4 steps laterally to his R towards the [employer]; limited secondary to fatigue ? ?Stairs ?  ?  ?  ?  ?  ? ?Wheelchair Mobility ?  ? ?Modified Rankin (Stroke Patients Only) ?  ? ?  ? ?Balance Overall balance assessment: Needs assistance ?Sitting-balance support: Feet supported ?Sitting balance-Leahy Scale: Fair ?  ?  ?  Standing balance support: During functional activity, Bilateral upper extremity supported ?Standing balance-Leahy Scale: Poor ?  ?  ?  ?  ?  ?  ?  ?  ?  ?  ?  ?  ?   ? ? ? ?Pertinent Vitals/Pain Pain Assessment ?Pain Assessment: No/denies pain   ? ? ?Home Living Family/patient expects to be discharged to:: Private residence ?Living Arrangements: Alone ?Available Help at Discharge: Friend(s);Available PRN/intermittently ?Type of Home: Apartment ?Home Access: Stairs to enter ?Entrance Stairs-Rails: Right;Left ?Entrance Stairs-Number of Steps: full flight ?  ?Home Layout: One level ?Home Equipment: Cane - single point;Crutches ?   ?  ?Prior Function Prior Level of Function : Independent/Modified Independent;Driving ?  ?  ?  ?  ?  ?  ?  ?ADLs Comments: Independent with ADLs, IADLs ?  ? ? ?Hand Dominance  ?   ? ?  ?Extremity/Trunk Assessment  ? Upper Extremity Assessment ?Upper Extremity Assessment: Generalized weakness ?  ? ?Lower Extremity Assessment ?Lower Extremity Assessment: Generalized weakness ?  ? ?Cervical / Trunk Assessment ?Cervical / Trunk Assessment: Kyphotic  ?Communication  ? Communication: No difficulties  ?Cognition Arousal/Alertness: Awake/alert ?Behavior During Therapy: [facility] for tasks assessed/performed ?Overall Cognitive Status: No family/caregiver present to determine baseline cognitive functioning ?Area of Impairment: Memory, Safety/judgement, Problem solving ?  ?  ?  ?  ?  ?  ?  ?  ?  ?  ?Memory: Decreased short-term memory ?  ?Safety/Judgement: Decreased awareness of deficits ?  ?Problem Solving: Slow processing, Decreased initiation, Difficulty sequencing, Requires verbal cues, Requires tactile cues ?  ?  ?  ? ?  ?General Comments   ? ?  ?Exercises General Exercises - Lower Extremity ?Ankle Circles/Pumps: AROM, Both, 20 reps, Seated  ? ?Assessment/Plan  ?  ?PT Assessment Patient needs continued PT services  ?PT Problem List Decreased strength;Decreased activity tolerance;Decreased balance;Decreased mobility;Decreased coordination;Decreased knowledge of use of DME;Decreased safety awareness;Decreased knowledge of precautions;Cardiopulmonary status limiting activity ? ?   ?  ?PT Treatment Interventions DME instruction;Gait training;Stair  training;Functional mobility training;Therapeutic activities;Therapeutic exercise;Balance training;Neuromuscular re-education;Patient/family education   ? ?PT Goals (Current goals can be found in the Care Plan section)  ?Acute Rehab PT Goals ?Patient Stated Goal: to feel better ?PT Goal Formulation: With patient ?Time For Goal Achievement: 04/24/21 ?Potential to Achieve Goals: Good ? ?  ?Frequency Min 3X/week ?  ? ? ?Co-evaluation   ?  ?  ?  ?  ? ? ?  ?AM-PAC PT "6 Clicks" Mobility  ?Outcome Measure Help needed turning from your back to your side while in a flat bed without using bedrails?: A Little ?Help needed moving from lying on your back to sitting on the side of a flat bed without using bedrails?: A Little ?Help needed moving to and from a bed to a chair (including a wheelchair)?: A Little ?Help needed standing up from a chair using your arms (e.g., wheelchair or bedside chair)?: A Little ?Help needed to walk in hospital room?: A Little ?Help needed climbing 3-5 steps with a railing? : A Lot ?6 Click Score: 17 ? ?  ?End of Session Equipment Utilized During Treatment: Oxygen ?Activity Tolerance: Patient limited by fatigue ?Patient left: with call bell/phone within reach;with bed alarm set;Other (comment) (sitting upright at EOB with NT present assisting pt with ordering his lunch) ?Nurse Communication: Mobility status ?PT Visit Diagnosis: Other abnormalities of gait and mobility (R26.89) ?  ? ?Time: 7829-5621 ?PT Time Calculation (min) (ACUTE ONLY): 23 min ? ? ?Charges:  PT Evaluation ?$PT Eval Moderate Complexity: 1 Mod ?PT Treatments ?$Therapeutic Activity: 8-22 mins ?  ?   ? ? ?Eduard Clos, PT, DPT  ?Acute Rehabilitation Services ?Office [phone] ? ? ?John Schaefer ?04/10/2021, 2:40 PM ? ?

## 2021-04-10 NOTE — Plan of Care (Signed)
  Problem: Nutrition: Goal: Adequate nutrition will be maintained Outcome: Progressing   Problem: Coping: Goal: Level of anxiety will decrease Outcome: Progressing   Problem: Pain Managment: Goal: General experience of comfort will improve Outcome: Progressing   Problem: Safety: Goal: Ability to remain free from injury will improve Outcome: Progressing   

## 2021-04-10 NOTE — Progress Notes (Signed)
ANTICOAGULATION CONSULT NOTE - Follow Up Consult ? ?Pharmacy Consult for Heparin infusion ?Indication: pulmonary embolus ? ?No Known Allergies ? ?Patient Measurements: ?Height: '5\' 10"'$  (177.8 cm) ?Weight: 106 kg (233 lb 11 oz) ?IBW/kg (Calculated) : 73 ?Heparin Dosing Weight: 95.7 kg ? ?Vital Signs: ?Temp: 99.2 ?F (37.3 ?C) (03/26 0504) ?Temp Source: Oral (03/26 0504) ?BP: 118/54 (03/26 0504) ?Pulse Rate: 72 (03/26 0504) ? ?Labs: ?Recent Labs  ?  04/08/21 ?1528 04/08/21 ?1642 04/08/21 ?1921 04/09/21 ?5366 04/09/21 ?4403 04/09/21 ?1545 04/10/21 ?0413  ?HGB 13.0  --   --  12.6*  --   --  12.4*  ?HCT 40.9  --   --  39.8  --   --  39.5  ?PLT 273  --   --  293  --   --  320  ?HEPARINUNFRC  --   --   --   --  >1.10* 0.77* 0.49  ?CREATININE 1.89*  --   --  2.00*  --   --  1.57*  ?TROPONINIHS  --  19* 18*  --   --   --   --   ? ? ?Estimated Creatinine Clearance: 57.2 mL/min (A) (by C-G formula based on SCr of 1.57 mg/dL (H)). ? ?Assessment: ?66 yo M presenting with shortness of breath found to have acute subsegmental PE with RV/LV ratio of 1.2, but mild clot burden. No anticoagulation noted PTA. Pharmacy consulted to manage heparin infusion. ? ?H/H/Plt stable ?Heparin level 0.49, therapeutic ?Current heparin infusion rate: 1250 units/hr ?No s/sx of bleeding, per RN ? ?Goal of Therapy:  ?Heparin level 0.3-0.7 units/ml ?Monitor platelets by anticoagulation protocol: Yes ?  ?Plan:  ?Continue heparin 1250 units/hr at therapeutic rate ?Monitor CBC and heparin level daily ?Continue to monitor for s/sx of bleeding ? ?Luisa Hart, PharmD, BCPS ?Clinical Pharmacist ?04/10/2021 7:30 AM  ? ?Please refer to [facility] for pharmacy phone number ?

## 2021-04-10 NOTE — Discharge Instructions (Addendum)
Information on my medicine - ELIQUIS? (apixaban) ? ?This medication education was reviewed with me or my healthcare representative as part of my discharge preparation. ? ?Why was Eliquis? prescribed for you? ?Eliquis? was prescribed to treat blood clots that may have been found in the veins of your legs (deep vein thrombosis) or in your lungs (pulmonary embolism) and to reduce the risk of them occurring again. ? ?What do You need to know about Eliquis? ? ?The starting dose is 10 mg (two 5 mg tablets) taken TWICE daily for the FIRST SEVEN (7) DAYS, then on 04/18/21  the dose is reduced to ONE 5 mg tablet taken TWICE daily.  Eliquis? may be taken with or without food.  ? ?Try to take the dose about the same time in the morning and in the evening. If you have difficulty swallowing the tablet whole please discuss with your pharmacist how to take the medication safely. ? ?Take Eliquis? exactly as prescribed and DO NOT stop taking Eliquis? without talking to the doctor who prescribed the medication.  Stopping may increase your risk of developing a new blood clot.  Refill your prescription before you run out. ? ?After discharge, you should have regular check-up appointments with your healthcare provider that is prescribing your Eliquis?. ?   ?What do you do if you miss a dose? ?If a dose of ELIQUIS? is not taken at the scheduled time, take it as soon as possible on the same day and twice-daily administration should be resumed. The dose should not be doubled to make up for a missed dose. ? ?Important Safety Information ?A possible side effect of Eliquis? is bleeding. You should call your healthcare provider right away if you experience any of the following: ?Bleeding from an injury or your nose that does not stop. ?Unusual colored urine (red or dark brown) or unusual colored stools (red or black). ?Unusual bruising for unknown reasons. ?A serious fall or if you hit your head (even if there is no bleeding). ? ?Some medicines  may interact with Eliquis? and might increase your risk of bleeding or clotting while on Eliquis?Marland Kitchen To help avoid this, consult your healthcare provider or pharmacist prior to using any new prescription or non-prescription medications, including herbals, vitamins, non-steroidal anti-inflammatory drugs (NSAIDs) and supplements. ? ?This website has more information on Eliquis? (apixaban): http://www.eliquis.com/eliquis/home  ? ?

## 2021-04-11 DIAGNOSIS — I2694 Multiple subsegmental pulmonary emboli without acute cor pulmonale: Secondary | ICD-10-CM

## 2021-04-11 DIAGNOSIS — C61 Malignant neoplasm of prostate: Secondary | ICD-10-CM

## 2021-04-11 DIAGNOSIS — N179 Acute kidney failure, unspecified: Secondary | ICD-10-CM

## 2021-04-11 LAB — BASIC METABOLIC PANEL
Anion gap: 5 (ref 5–15)
BUN: 14 mg/dL (ref 8–23)
CO2: 38 mmol/L — ABNORMAL HIGH (ref 22–32)
Calcium: 8.6 mg/dL — ABNORMAL LOW (ref 8.9–10.3)
Chloride: 98 mmol/L (ref 98–111)
Creatinine, Ser: 1.41 mg/dL — ABNORMAL HIGH (ref 0.61–1.24)
GFR, Estimated: 55 mL/min — ABNORMAL LOW (ref >60)
Glucose, Bld: 119 mg/dL — ABNORMAL HIGH (ref 70–99)
Potassium: 4.1 mmol/L (ref 3.5–5.1)
Sodium: 141 mmol/L (ref 135–145)

## 2021-04-11 LAB — CBC
HCT: 38.3 % — ABNORMAL LOW (ref 39.0–52.0)
Hemoglobin: 12 g/dL — ABNORMAL LOW (ref 13.0–17.0)
MCH: 28.2 pg (ref 26.0–34.0)
MCHC: 31.3 g/dL (ref 30.0–36.0)
MCV: 89.9 fL (ref 80.0–100.0)
Platelets: 370 10*3/uL (ref 150–400)
RBC: 4.26 MIL/uL (ref 4.22–5.81)
RDW: 13.6 % (ref 11.5–15.5)
WBC: 8.5 10*3/uL (ref 4.0–10.5)
nRBC: 0 % (ref 0.0–0.2)

## 2021-04-11 LAB — HEPARIN LEVEL (UNFRACTIONATED): Heparin Unfractionated: >1.1 IU/mL — ABNORMAL HIGH (ref 0.30–0.70)

## 2021-04-11 MED ORDER — POLYETHYLENE GLYCOL 3350 17 G PO PACK: 17.0000 g | PACK | Freq: Every day | ORAL | 0 refills | Status: AC | PRN

## 2021-04-11 MED ORDER — APIXABAN 5 MG PO TABS: 10.0000 mg | ORAL_TABLET | Freq: Two times a day (BID) | ORAL | 0 refills | Status: AC

## 2021-04-11 MED ORDER — APIXABAN 5 MG PO TABS: 5.0000 mg | ORAL_TABLET | Freq: Two times a day (BID) | ORAL | 0 refills | Status: AC

## 2021-04-11 MED ORDER — APIXABAN (ELIQUIS) EDUCATION KIT FOR DVT/PE PATIENTS: 1.0000 | PACK | Freq: Once | Status: AC

## 2021-04-11 NOTE — Progress Notes (Signed)
Physical Therapy Treatment ?Patient Details ?Name: John Schaefer ?MRN: [id number] ?DOB: January 10, 1956 ?Today's Date: 04/11/2021 ? ? ?History of Present Illness Pt is a 66 y/o male admitted secondary to SOB and found to have an acute PE. Heparin was initiated and his levels have been within therapeutic range. PMH including but not limited to prostate cancer (09/2019) s/p prostatectomy and adjuvant radiation, hypertension and diabetes. ? ?  ?PT Comments  ? ? Pt was able to mobilize in bed and sit to stand with supervision assist to monitor vitals and symptoms. Pt walked with min guard assistance on RA and SpaO2 destated to 85 with moderate SOB. He was able to recover with a brief standing rest break and 3 L of supplemental O2. During the session pt was exhibited limited endurance and activity tolerance as seen by vitals. Pt lives alone and has a full flight of stairs to enter his apartment, concern for return to home with current mobility status. Recommending therapy services at a skilled nursing facility to address the previously stated deficits. Will continue to follow acutely to maximize functional mobility, independence, and safety. ?  ?Recommendations for follow up therapy are one component of a multi-disciplinary discharge planning process, led by the attending physician.  Recommendations may be updated based on patient status, additional functional criteria and insurance authorization. ? ?Follow Up Recommendations ? Skilled nursing-short term rehab (<3 hours/day) ?  ?  ?Assistance Recommended at Discharge Frequent or constant Supervision/Assistance  ?Patient can return home with the following A lot of help with walking and/or transfers;A lot of help with bathing/dressing/bathroom;Help with stairs or ramp for entrance ?  ?Equipment Recommendations ? Rolling walker (2 wheels);BSC/3in1  ?  ?Recommendations for Other Services   ? ? ?  ?Precautions / Restrictions Precautions ?Precautions: Fall ?Restrictions ?Weight Bearing  Restrictions: No  ?  ? ?Mobility ? Bed Mobility ?Overal bed mobility: Needs Assistance ?Bed Mobility: Supine to Sit ?  ?  ?Supine to sit: Supervision ?  ?  ?General bed mobility comments: HOB was elevated. ?  ? ?Transfers ?Overall transfer level: Needs assistance ?  ?Transfers: Sit to/from Stand ?Sit to Stand: Supervision ?  ?  ?  ?  ?  ?General transfer comment: Pt was able to stand with supervision from low bed. Supervision was provided due to fall risk status and to monitor symptoms. ?  ? ?Ambulation/Gait ?Ambulation/Gait assistance: Min guard ?Gait Distance (Feet): 100 Feet (with a standing break during ambulation.) ?Assistive device: Rolling walker (2 wheels) ?Gait Pattern/deviations: Decreased stride length ?  ?  ?  ?General Gait Details: Pt was able to ambulate in the hallway. He ambulated with light use of his walker and reported that he does not need his walker on flat surfaces, only going up and down hill. He was able to able to ambulate in the room with supervisions from bed to the recliner and was a little more unsteady with gait without walker. ? ? ?Stairs ?  ?  ?  ?  ?  ? ? ?Wheelchair Mobility ?  ? ?Modified Rankin (Stroke Patients Only) ?  ? ? ?  ?Balance Overall balance assessment: Needs assistance ?Sitting-balance support: Feet supported ?Sitting balance-Leahy Scale: Good ?Sitting balance - Comments: Pt exhibited good sitting balance requiring no support to sit EOB. ?  ?Standing balance support: During functional activity, Bilateral upper extremity supported ?Standing balance-Leahy Scale: Fair ?Standing balance comment: Pt was able to ambulate in the room without walker or physical assistance but was unsteady and reaching for  support as he walked. ?  ?  ?  ?  ?  ?  ?  ?  ?  ?  ?  ?  ? ?  ?Cognition Arousal/Alertness: Awake/alert ?Behavior During Therapy: [facility] for tasks assessed/performed ?Overall Cognitive Status: No family/caregiver present to determine baseline cognitive functioning ?  ?  ?  ?  ?   ?  ?  ?  ?  ?  ?  ?  ?  ?  ?  ?Problem Solving: Slow processing ?General Comments: Pt was slow to respond to commands and questions ?  ?  ? ?  ?Exercises   ? ?  ?General Comments General comments (skin integrity, edema, etc.): During ambulation on RA pt's SPaO2 dropped to 85 during the first 25 ft and was able to recover to 97 while continuing to walk on 3L os supplemental O2. ?  ?  ? ?Pertinent Vitals/Pain Pain Assessment ?Pain Assessment: No/denies pain  ? ? ?Home Living   ?  ?  ?  ?  ?  ?  ?  ?  ?  ?   ?  ?Prior Function    ?  ?  ?   ? ?PT Goals (current goals can now be found in the care plan section)   ? ?  ?Frequency ? ? ? Min 3X/week ? ? ? ?  ?PT Plan    ? ? ?Co-evaluation   ?  ?  ?  ?  ? ?  ?AM-PAC PT "6 Clicks" Mobility   ?Outcome Measure ? Help needed turning from your back to your side while in a flat bed without using bedrails?: None ?Help needed moving from lying on your back to sitting on the side of a flat bed without using bedrails?: None ?Help needed moving to and from a bed to a chair (including a wheelchair)?: None ?Help needed standing up from a chair using your arms (e.g., wheelchair or bedside chair)?: None ?Help needed to walk in hospital room?: A Little ?Help needed climbing 3-5 steps with a railing? : A Lot ?6 Click Score: 21 ? ?  ?End of Session Equipment Utilized During Treatment: Oxygen ?Activity Tolerance: Patient limited by fatigue ?Patient left: with call bell/phone within reach;in chair;with chair alarm set;with nursing/sitter in room ?Nurse Communication: Mobility status ?PT Visit Diagnosis: Other abnormalities of gait and mobility (R26.89) ?  ? ? ?Time: 2633-3545 ?PT Time Calculation (min) (ACUTE ONLY): 24 min ? ?Charges:  $Gait Training: 8-22 mins ?$Therapeutic Activity: 8-22 mins          ?          ? ?Quenton Fetter, SPT ? ? ? ?Quenton Fetter ?04/11/2021, 12:48 PM ? ?

## 2021-04-11 NOTE — TOC Initial Note (Signed)
Transition of Care (TOC) - Initial/Assessment Note  ? ? ?Patient Details  ?Name: Nosson Wender Barto ?MRN: [id number] ?Date of Birth: Mar 31, 1955 ? ?Transition of Care (TOC) CM/SW Contact:    ?Bartholomew Crews, RN ?Phone Number: [phone] ?04/11/2021, 9:27 AM ? ?Clinical Narrative:                 ? ?Spoke to patient at the bedside to discuss post acute transition. Patient from home alone previously independent with no DME or [place] services. Patient would like SNF before transition home preferably in [facility]. CSW to assist with SNF workup.  ? ?Expected Discharge Plan: [place] ?Barriers to Discharge: Continued Medical Work up ? ? ?Patient Goals and CMS Choice ?Patient states their goals for this hospitalization and ongoing recovery are:: would like SNF then home ?CMS Medicare.gov Compare Post Acute Care list provided to:: Patient ?Choice offered to / list presented to : Patient ? ?Expected Discharge Plan and Services ?Expected Discharge Plan: [place] ?In-house Referral: Clinical Social Work ?Discharge Planning Services: CM Consult ?Post Acute Care Choice: [place] ?Living arrangements for the past 2 months: [place] ?                ?DME Arranged: N/A ?DME Agency: NA ?  ?  ?  ?HH Arranged: NA ?[place] Agency: NA ?  ?  ?  ? ?Prior Living Arrangements/Services ?Living arrangements for the past 2 months: [place] ?Lives with:: Self ?Patient language and need for interpreter reviewed:: Yes ?Do you feel safe going back to the place where you live?: Yes      ?  ?  ?  ?Criminal Activity/Legal Involvement Pertinent to Current Situation/Hospitalization: No - Comment as needed ? ?Activities of Daily Living ?Home Assistive Devices/Equipment: None ?ADL Screening (condition at time of admission) ?Patient's cognitive ability adequate to safely complete daily activities?: Yes ?Is the patient deaf or have difficulty hearing?: No ?Does the patient have difficulty seeing, even  when wearing glasses/contacts?: No ?Does the patient have difficulty concentrating, remembering, or making decisions?: No ?Patient able to express need for assistance with ADLs?: Yes ?Does the patient have difficulty dressing or bathing?: No ?Independently performs ADLs?: Yes (appropriate for developmental age) ?Does the patient have difficulty walking or climbing stairs?: No ?Weakness of Legs: None ?Weakness of Arms/Hands: None ? ?Permission Sought/Granted ?  ?  ?   ?   ?   ?   ? ?Emotional Assessment ?Appearance:: Appears stated age ?Attitude/Demeanor/Rapport: Engaged ?Affect (typically observed): Accepting ?Orientation: : Oriented to Self, Oriented to Place, Oriented to  Time, Oriented to Situation ?Alcohol / Substance Use: Not Applicable ?Psych Involvement: No (comment) ? ?Admission diagnosis:  Pulmonary embolism ([place]) [I26.99] ?Multifocal pneumonia [J18.9] ?Acute pulmonary embolism, unspecified pulmonary embolism type, unspecified whether acute cor pulmonale present ([place]) [I26.99] ?Patient Active Problem List  ? Diagnosis Date Noted  ? Acute pulmonary embolism ([place]) 04/08/2021  ? AKI (acute kidney injury) ([place]) 04/08/2021  ? CAP (community acquired pneumonia) 04/08/2021  ? Hypokalemia 04/08/2021  ? Pulmonary embolism ([place]) 04/08/2021  ? 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 Prostate cancer ([place]) 10/06/2019  ? Elevated PSA, greater than or equal to 20 ng/ml 08/16/2016  ? Hyperlipidemia 08/12/2016  ? BMI 37.0-37.9, adult 08/12/2016  ? Erectile dysfunction 08/12/2016  ? Type 2 diabetes mellitus without complication, without long-term current use of insulin ([place]) 08/12/2016  ? Chronic gout 02/20/2014  ? Essential hypertension 10/27/2012  ?  Gout 10/27/2012  ? ?PCP:  Vassie Moment, MD ?Pharmacy:   ?[place] ([place] [place] - [address] ?[address] ?[place] [address] ?Phone: [phone] Fax:  [phone] ? ? ? ? ?Social Determinants of Health (SDOH) Interventions ?  ? ?Readmission Risk Interventions ?   ? View : No data to display.  ?  ?  ?  ? ? ? ?

## 2021-04-11 NOTE — Progress Notes (Signed)
AVS and social worker's paperwork placed in discharge packet. Report called and given to Webb Silversmith, LPN at [facility]. All questions answered to satisfaction. [facility] to transport pt with all belongings.  ?

## 2021-04-11 NOTE — [place] FL2 (Signed)
?[place] MEDICAID FL2 LEVEL OF CARE SCREENING TOOL  ?  ? ?IDENTIFICATION  ?Patient Name: ?Teri Diltz Spratley Birthdate: [birth year] Sex: male Admission Date (Current Location): ?04/08/2021  ?[place] and [place] Number: ? [place] ?  Facility and Address:  ?[facility], [place] [address] [place],  [address] ?     Provider Number: ?[id number]  ?Attending Physician Name and Address:  ?Lucious Groves, DO ? Relative Name and Phone Number:  ?Dianna Limbo Sister [phone] ?   ?Current Level of Care: ?  Recommended Level of Care: ?[place] Prior Approval Number: ?  ? ?Date Approved/Denied: ?  PASRR Number: ?[id number] A ? ?Discharge Plan: ?SNF ?  ? ?Current Diagnoses: ?Patient Active Problem List  ? Diagnosis Date Noted  ? Acute pulmonary embolism ([place]) 04/08/2021  ? AKI (acute kidney injury) ([place]) 04/08/2021  ? CAP (community acquired pneumonia) 04/08/2021  ? Hypokalemia 04/08/2021  ? Pulmonary embolism ([place]) 04/08/2021  ? 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 Prostate cancer ([place]) 10/06/2019  ? Elevated PSA, greater than or equal to 20 ng/ml 08/16/2016  ? Hyperlipidemia 08/12/2016  ? BMI 37.0-37.9, adult 08/12/2016  ? Erectile dysfunction 08/12/2016  ? Type 2 diabetes mellitus without complication, without long-term current use of insulin ([place]) 08/12/2016  ? Chronic gout 02/20/2014  ? Essential hypertension 10/27/2012  ? Gout 10/27/2012  ? ? ?Orientation RESPIRATION BLADDER Height & Weight   ?  ?Self, Time, Situation, Place ? O2 Incontinent, External catheter Weight: 233 lb 11 oz (106 kg) ?Height:  '5\' 10"'  (177.8 cm)  ?BEHAVIORAL SYMPTOMS/MOOD NEUROLOGICAL BOWEL NUTRITION STATUS  ?    Continent Diet (see discharge summary)  ?AMBULATORY STATUS COMMUNICATION OF NEEDS Skin   ?Supervision Verbally Normal ?  ?  ?  ?    ?     ?     ? ? ?Personal Care Assistance Level of Assistance  ?Bathing, Feeding, Dressing Bathing Assistance: Limited  assistance ?Feeding assistance: Independent ?Dressing Assistance: Limited assistance ?   ? ?Functional Limitations Info  ?Sight, Hearing, Speech Sight Info: Adequate ?Hearing Info: Adequate ?Speech Info: Adequate  ? ? ?SPECIAL CARE FACTORS FREQUENCY  ?PT (By licensed PT), OT (By licensed OT)   ?  ?PT Frequency: 5x week ?OT Frequency: 5x week ?  ?  ?  ?   ? ? ?Contractures Contractures Info: Not present  ? ? ?Additional Factors Info  ?Code Status, Allergies Code Status Info: full ?Allergies Info: NKA ?  ?  ?  ?   ? ?Current Medications (04/11/2021):  This is the current hospital active medication list ?Current Facility-Administered Medications  ?Medication Dose Route Frequency Provider Last Rate Last Admin  ? apixaban Arne Cleveland) Education Kit for DVT/PE patients   Does not apply Once Rosezetta Schlatter, MD      ? apixaban Arne Cleveland) tablet 10 mg  10 mg Oral BID Rosezetta Schlatter, MD   10 mg at 04/10/21 2246  ? Followed by  ? [START ON 04/17/2021] apixaban (ELIQUIS) tablet 5 mg  5 mg Oral BID Rosezetta Schlatter, MD      ? atorvastatin (LIPITOR) tablet 20 mg  20 mg Oral Daily Idamae Schuller, MD   20 mg at 04/10/21 1047  ? polyethylene glycol (MIRALAX / GLYCOLAX) packet 17 g  17 g Oral Daily PRN Rosezetta Schlatter, MD      ? ? ? ?Discharge Medications: ?Please see discharge summary for a list of discharge medications. ? ?Relevant Imaging Results: ? ?Relevant  Lab Results: ? ? ?Additional Information ?SSN: [id number] ? ?Joanne Chars, LCSW ? ? ? ? ?

## 2021-04-11 NOTE — Progress Notes (Signed)
SATURATION QUALIFICATIONS: (This note is used to comply with regulatory documentation for home oxygen) ? ?Patient Saturations on Room Air at Rest = 95% ? ?Patient Saturations on Room Air while Ambulating = 85% ? ?Patient Saturations on 3 Liters of oxygen while Ambulating = 97% ? ?Please briefly explain why patient needs home oxygen: Pt was hypoxic while ambulating. ? ?Quenton Fetter, SPT ? ?

## 2021-04-11 NOTE — Plan of Care (Signed)
?  Problem: Education: ?Goal: Knowledge of General Education information will improve ?Description: Including pain rating scale, medication(s)/side effects and non-pharmacologic comfort measures ?Outcome: Progressing ?  ?Problem: Health Behavior/Discharge Planning: ?Goal: Ability to manage health-related needs will improve ?Outcome: Progressing ?  ?Problem: Clinical Measurements: ?Goal: Ability to maintain clinical measurements within normal limits will improve ?Outcome: Progressing ?Goal: Respiratory complications will improve ?Outcome: Progressing ?  ?Problem: Activity: ?Goal: Risk for activity intolerance will decrease ?Outcome: Progressing ?  ?Problem: Nutrition: ?Goal: Adequate nutrition will be maintained ?Outcome: Progressing ?  ?Problem: Elimination: ?Goal: Will not experience complications related to urinary retention ?Outcome: Progressing ?  ?Problem: Pain Managment: ?Goal: General experience of comfort will improve ?Outcome: Progressing ?  ?Problem: Safety: ?Goal: Ability to remain free from injury will improve ?Outcome: Progressing ?  ?Problem: Skin Integrity: ?Goal: Risk for impaired skin integrity will decrease ?Outcome: Progressing ?  ?

## 2021-04-11 NOTE — TOC Transition Note (Signed)
Transition of Care (TOC) - CM/SW Discharge Note ? ? ?Patient Details  ?Name: John Schaefer ?MRN: [id number] ?Date of Birth: [birth year] ? ?Transition of Care [facility]) CM/SW Contact:  ?Joanne Chars, LCSW ?Phone Number: ?04/11/2021, 2:32 PM ? ? ?Clinical Narrative:   Pt discharging to [facility].  RN call [phone] for report.  ? ? ? ?Final next level of care: [place] ?Barriers to Discharge: Barriers Resolved ? ? ?Patient Goals and CMS Choice ?Patient states their goals for this hospitalization and ongoing recovery are:: would like SNF then home ?CMS Medicare.gov Compare Post Acute Care list provided to:: Patient ?Choice offered to / list presented to : Patient ? ?Discharge Placement ?  ?           ?Patient [facility] bed at:  [facility]) ?Patient to be transferred to facility by: [facility] ?Name of family member notified: none ?Patient and family notified of of transfer: 04/11/21 ? ?Discharge Plan and Services ?In-house Referral: Clinical Social Work ?Discharge Planning Services: CM Consult ?Post Acute Care Choice: [place]          ?DME Arranged: N/A ?DME Agency: NA ?  ?  ?  ?HH Arranged: NA ?[place] Agency: NA ?  ?  ?  ? ?Social Determinants of Health (SDOH) Interventions ?  ? ? ?Readmission Risk Interventions ?   ? View : No data to display.  ?  ?  ?  ? ? ? ? ? ?

## 2021-04-11 NOTE — TOC Progression Note (Signed)
Transition of Care (TOC) - Progression Note  ? ? ?Patient Details  ?Name: John Schaefer ?MRN: [id number] ?Date of Birth: 08-02-1955 ? ?Transition of Care (TOC) CM/SW Contact  ?Joanne Chars, LCSW ?Phone Number: ?04/11/2021, 2:21 PM ? ?Clinical Narrative:  CSW presented bed offers to pt and he requested time to think about it.  ? ?1410: CSW spoke with pt about bed offers, he would like to choose [place].  CSW contacted [facility] and she will confirm if she has bed available today.   ? ? ? ?Expected Discharge Plan: [place] ?Barriers to Discharge: SNF Pending bed offer ? ?Expected Discharge Plan and Services ?Expected Discharge Plan: [place] ?In-house Referral: Clinical Social Work ?Discharge Planning Services: CM Consult ?Post Acute Care Choice: [place] ?Living arrangements for the past 2 months: [place] ?Expected Discharge Date: 04/11/21               ?DME Arranged: N/A ?DME Agency: NA ?  ?  ?  ?HH Arranged: NA ?[facility] Agency: NA ?  ?  ?  ? ? ?Social Determinants of Health (SDOH) Interventions ?  ? ?Readmission Risk Interventions ?   ? View : No data to display.  ?  ?  ?  ? ? ?

## 2021-04-11 NOTE — Discharge Summary (Signed)
? ?Name: John Schaefer ?MRN: [id number] ?DOB: Feb 06, 1955 66 y.o. ?PCP: Vassie Moment, MD ? ?Date of Admission: 04/08/2021  3:35 PM ?Date of Discharge:   04/11/21 1:50 PM ?Attending Physician: Lottie Mussel, MD ? ?Discharge Diagnosis: ?1. Acute hypoxic respiratory failure 2/2 unprovoked submassive PE ?2.Cryptogenic AKI ?3. Hypertension ?4. T2DM ?5. Hyperlipidemia ? ?Discharge Medications: ?Allergies as of 04/11/2021   ?No Known Allergies ?  ? ?  ?Medication List  ?  ? ?STOP taking these medications   ? ?amLODipine 10 MG tablet ?Commonly known as: NORVASC ?  ?Fish Oil 300 MG Caps ?  ?GARLIC PO ?  ?hydrochlorothiazide 25 MG tablet ?Commonly known as: HYDRODIURIL ?  ?losartan 100 MG tablet ?Commonly known as: COZAAR ?  ?OVER THE COUNTER MEDICATION ?  ?sildenafil 20 MG tablet ?Commonly known as: REVATIO ?  ?traMADol 50 MG tablet ?Commonly known as: Ultram ?  ?vardenafil 20 MG tablet ?Commonly known as: LEVITRA ?  ? ?  ? ?TAKE these medications   ? ?apixaban 5 MG Tabs tablet ?Commonly known as: ELIQUIS ?Take 2 tablets (10 mg total) by mouth 2 (two) times daily for 6 days. ?  ?apixaban 5 MG Tabs tablet ?Commonly known as: ELIQUIS ?Take 1 tablet (5 mg total) by mouth 2 (two) times daily. ?Start taking on: April 17, 2021 ?  ?apixaban Kit ?Commonly known as: ELIQUIS ?1 each by Does not apply route once for 1 dose. ?  ?atorvastatin 20 MG tablet ?Commonly known as: LIPITOR ?TAKE 1 TABLET BY MOUTH ONCE DAILY ?  ?metFORMIN 1000 MG tablet ?Commonly known as: GLUCOPHAGE ?Take 1,000 mg by mouth 2 (two) times daily with a meal. ?  ?polyethylene glycol 17 g packet ?Commonly known as: MIRALAX / GLYCOLAX ?Take 17 g by mouth daily as needed for mild constipation or moderate constipation. ?  ? ?  ? ? ?Disposition and follow-up:   ?Mr.John Schaefer was discharged from [facility] in [place] condition.  At the hospital follow up visit please address: ? ?1.  Unprovoked PE- patient currently on Eliquis with titration of medications.   ?AKI- unclear etiology, assess renal function ?HTN- assess when to restart medications/adjust as needed. ? ?2.  Labs / imaging needed at time of follow-up: BMP ? ?3.  Pending labs/ test needing follow-up: N/A ? ?Follow-up Appointments: ? ? ?Hospital Course by problem list: ?#Acute hypoxic respiratory failure ?#Acute Unprovoked Submassive Pulmonary Embolism ?On day of discharge patient not endorsing SOB while on room air with recorded spO2 of 90% on our team's assessment. He was seen by PT today, O2 requirement of 3L was identified with ambulation, will need to discharge with home oxygen. Patient remained afebrile with no acute signs of infection, infiltrates on imaging were likely related to the PE vs concurrent infection. Discharge medication regimen: Eliquis 10 mg BID x 7 days followed by Eliquis 5 mg BID. ?  ?#Acute Kidney Injury ?Patient with no prior hx of chronic kidney disease. Baseline creatinine unclear, ~1.3-1.4. Unclear what may have caused AKI. Kidney function continues to improve, creatinine downtrending, 1.41 on day of discharge from 2 (3/25). He is euvolemic on exam and has maintained good urinary output.  ?  ?#Hypertension ?Patient has hx of hypertension. Home meds include Norvasc 10 mg qd, HCTZ 25 mg qd, and losartan 100 mg daily. Patient was normotensive throughout admission so his antihypertensives were held.  ?  ?#Diabetes Mellitus II ?A1c this admission at 6.6. His home medication is Metformin 1000 mg BID. Patient had SSI  with meals and at bedtime during this admission. ?  ?#Hyperlipidemia ?Home lipitor 20 mg daily resumed throughout admission. ?  ?#Hypokalemia; resolved ? ?Discharge Exam:   ?BP 126/75 (BP Location: Right Arm)   Pulse 62   Temp 98.2 ?F (36.8 ?C) (Oral)   Resp 18   Ht '5\' 10"'  (1.778 m)   Wt 106 kg   SpO2 90%   BMI 33.53 kg/m?  ?Discharge exam:  ?Constitutional: elderly man sitting comfortably in bed, conversant, in NAD ?HEENT: normocephalic, atraumatic, moist mucus  membranes ?Cardio: normal rate and rhythm ?Pulm: no increased work of breathing on room air, no crackles or wheezing ?Abd: non-distended, non-tender, +bowel sounds ?Extremities: warm and dry, normal skin turgor to bilateral LE ?Neuro: alert and oriented x3, answering questions appropriately ?Psych: normal affect ? ?Pertinent Labs, Studies, and Procedures:  ? ?CT Angio Chest PE W and/or Wo Contrast ? ?Result Date: 04/08/2021 ?CLINICAL DATA:  Shortness of breath for several days, prostate cancer EXAM: CT ANGIOGRAPHY CHEST WITH CONTRAST TECHNIQUE: Multidetector CT imaging of the chest was performed using the standard protocol during bolus administration of intravenous contrast. Multiplanar CT image reconstructions and MIPs were obtained to evaluate the vascular anatomy. RADIATION DOSE REDUCTION: This exam was performed according to the departmental dose-optimization program which includes automated exposure control, adjustment of the mA and/or kV according to patient size and/or use of iterative reconstruction technique. CONTRAST:  146m OMNIPAQUE IOHEXOL 350 MG/ML SOLN COMPARISON:  04/08/2021, 03/30/2020 FINDINGS: Cardiovascular: This is a technically adequate evaluation of the pulmonary vasculature. There are bilateral segmental pulmonary emboli most pronounced within the right upper and left lower lobes. Equivocal segmental emboli within the right lower lobe, evaluation limited by respiratory motion and adjacent lung consolidation. There is mild clot burden. No evidence of central pulmonary emboli. The RV/LV ratio is calculated at 1.2, though there is no straightening or bowing of the interventricular septum and the configuration of the cardiac chambers appear similar to prior abdominal CT 04/15/2019. This may be related to chronic right ventricular dilatation due to pulmonary arterial hypertension. No evidence of pericardial effusion. Ectasia of the thoracic aorta without aneurysm or dissection. Mild atherosclerosis  of the aortic arch and coronary vasculature. Mediastinum/Nodes: No enlarged mediastinal, hilar, or axillary lymph nodes. Thyroid gland, trachea, and esophagus demonstrate no significant findings. Lungs/Pleura: Multifocal areas of dense consolidation are identified, greatest in the right upper and bilateral lower lobes. The appearance is most consistent with multifocal pneumonia, though underlying pulmonary infarct cannot be excluded in light of pulmonary emboli. No effusion or pneumothorax. Central airways are patent. Upper Abdomen: No acute abnormality. Musculoskeletal: There are no acute or destructive bony lesions. Reconstructed images demonstrate no additional findings. Review of the MIP images confirms the above findings. IMPRESSION: 1. Acute segmental pulmonary emboli involving the right upper and left lower lobes. RV/LV ratio is elevated measuring 1.2, despite only mild clot burden. Based on the similarity to prior examinations, this may be due to underlying chronic pulmonary hypertension rather than acute right heart strain. Please correlate with clinical presentation and patient status. 2. Dense areas of multifocal consolidation most pronounced in the right upper lobe and bilateral lower lobes. Favor multifocal pneumonia over pulmonary infarcts. 3.  Aortic Atherosclerosis (ICD10-I70.0). Critical Value/emergent results were called by telephone at the time of interpretation on 04/08/2021 at 7:17 pm to provider ADAM CURATOLO , who verbally acknowledged these results. Electronically Signed   By: MRanda NgoM.D.   On: 04/08/2021 19:21  ? ?DG Chest Port 1 View ? ?  Result Date: 04/08/2021 ?CLINICAL DATA:  Shortness of breath for several days. EXAM: PORTABLE CHEST 1 VIEW COMPARISON:  None. FINDINGS: Low lung volumes are noted. Moderate severity bibasilar scarring and/or linear atelectasis is seen. Mild right perihilar atelectasis and/or infiltrate is also noted. There is no evidence of a pleural effusion or  pneumothorax. The heart size and mediastinal contours are within normal limits. The visualized skeletal structures are unremarkable. IMPRESSION: Low lung volumes with mild right perihilar and moderate severity bibasilar [place]

## 2021-04-11 NOTE — Evaluation (Signed)
Occupational Therapy Evaluation ?Patient Details ?Name: John Schaefer ?MRN: [id number] ?DOB: Feb 10, 1955 ?Today's Date: 04/11/2021 ? ? ?History of Present Illness Pt is a 66 y/o male admitted secondary to SOB and found to have an acute PE. Heparin was initiated and his levels have been within therapeutic range. PMH including but not limited to prostate cancer (09/2019) s/p prostatectomy and adjuvant radiation, hypertension and diabetes.  ? ?Clinical Impression ?  ?Pt reports independence at baseline with ADLs and functional mobility, reports living alone with few friends in his apartment complex that would be able to provide assist at d/c. Pt lives in second floor apartment with flight of steps to enter. Pt currently set up -min A for ADLs, min guard for transfers. VSS throughout session on 2L O2, pt reporting minimal fatigue after walking short distances. Pt presenting with impairments listed below, will follow acutely. Recommend SNF at d/c. ?   ? ?Recommendations for follow up therapy are one component of a multi-disciplinary discharge planning process, led by the attending physician.  Recommendations may be updated based on patient status, additional functional criteria and insurance authorization.  ? ?Follow Up Recommendations ? Skilled nursing-short term rehab (<3 hours/day)  ?  ?Assistance Recommended at Discharge Intermittent Supervision/Assistance  ?Patient can return home with the following A little help with walking and/or transfers;A little help with bathing/dressing/bathroom;Assistance with cooking/housework;Help with stairs or ramp for entrance;Assist for transportation ? ?  ?Functional Status Assessment ? Patient has had a recent decline in their functional status and demonstrates the ability to make significant improvements in function in a reasonable and predictable amount of time.  ?Equipment Recommendations ? BSC/3in1  ?  ?Recommendations for Other Services   ? ? ?  ?Precautions / Restrictions  Precautions ?Precautions: Fall ?Restrictions ?Weight Bearing Restrictions: No  ? ?  ? ?Mobility Bed Mobility ?  ?  ?  ?  ?  ?  ?  ?General bed mobility comments: pt sitting up in chair upon arrival ?  ? ?Transfers ?Overall transfer level: Needs assistance ?  ?Transfers: Sit to/from Stand ?Sit to Stand: Supervision ?  ?  ?  ?  ?  ?  ?  ? ?  ?Balance Overall balance assessment: Needs assistance ?Sitting-balance support: Feet supported ?Sitting balance-Leahy Scale: Good ?Sitting balance - Comments: able to complete figure 4 technique sitting up in chair ?  ?Standing balance support: During functional activity, Bilateral upper extremity supported ?Standing balance-Leahy Scale: Fair ?Standing balance comment: walks without AD to bathroom ?  ?  ?  ?  ?  ?  ?  ?  ?  ?  ?  ?   ? ?ADL either performed or assessed with clinical judgement  ? ?ADL   ?  ?  ?  ?  ?  ?  ?  ?  ?  ?  ?  ?  ?  ?  ?  ?  ?  ?  ?  ?   ? ? ? ?Vision   ?Vision Assessment?: No apparent visual deficits  ?   ?Perception   ?  ?Praxis   ?  ? ?Pertinent Vitals/Pain Pain Assessment ?Pain Assessment: No/denies pain  ? ? ? ?Hand Dominance   ?  ?Extremity/Trunk Assessment Upper Extremity Assessment ?Upper Extremity Assessment: Overall WFL for tasks assessed ?  ?Lower Extremity Assessment ?Lower Extremity Assessment: Defer to PT evaluation ?  ?Cervical / Trunk Assessment ?Cervical / Trunk Assessment: Kyphotic ?  ?Communication Communication ?Communication: No difficulties ?  ?Cognition Arousal/Alertness: Awake/alert ?  Behavior During Therapy: [facility] for tasks assessed/performed ?Overall Cognitive Status: No family/caregiver present to determine baseline cognitive functioning ?  ?  ?  ?  ?  ?  ?  ?  ?  ?  ?  ?  ?  ?  ?  ?Problem Solving: Slow processing ?General Comments: Pt was slow to respond to commands and questions ?  ?  ?General Comments  VSS on 2L O2 ? ?  ?Exercises   ?  ?Shoulder Instructions    ? ? ?Home Living Family/patient expects to be discharged to::  Private residence ?Living Arrangements: Alone ?Available Help at Discharge: Friend(s);Available PRN/intermittently ?Type of Home: Apartment ?Home Access: Stairs to enter ?Entrance Stairs-Number of Steps: full flight ?Entrance Stairs-Rails: Right;Left ?Home Layout: One level ?  ?  ?Bathroom Shower/Tub: Gaffer;Tub/shower unit ?  ?  ?  ?  ?Home Equipment: Cane - single point;Crutches ?  ?Additional Comments: second floor apartment ?  ? ?  ?Prior Functioning/Environment Prior Level of Function : Independent/Modified Independent;Driving ?  ?  ?  ?  ?  ?  ?Mobility Comments: reports using cane after falling last Fall, since then does not use AD ?ADLs Comments: Independent with ADLs, IADLs ?  ? ?  ?  ?OT Problem List: Decreased strength;Decreased activity tolerance;Decreased range of motion;Impaired balance (sitting and/or standing);Decreased knowledge of use of DME or AE ?  ?   ?OT Treatment/Interventions: Energy conservation;Therapeutic exercise;Self-care/ADL training;DME and/or AE instruction;Therapeutic activities;Patient/family education;Balance training  ?  ?OT Goals(Current goals can be found in the care plan section) Acute Rehab OT Goals ?Patient Stated Goal: none stated ?OT Goal Formulation: With patient ?Time For Goal Achievement: 04/25/21 ?Potential to Achieve Goals: Good  ?OT Frequency: Min 3X/week ?  ? ?Co-evaluation   ?  ?  ?  ?  ? ?  ?AM-PAC OT "6 Clicks" Daily Activity     ?Outcome Measure Help from another person eating meals?: None ?Help from another person taking care of personal grooming?: A Little ?Help from another person toileting, which includes using toliet, bedpan, or urinal?: A Little ?Help from another person bathing (including washing, rinsing, drying)?: A Lot ?Help from another person to put on and taking off regular upper body clothing?: A Little ?Help from another person to put on and taking off regular lower body clothing?: A Lot ?6 Click Score: 17 ?  ?End of Session Equipment  Utilized During Treatment: Oxygen ?Nurse Communication: Mobility status;Other (comment) (notified of tele box warning) ? ?Activity Tolerance: Patient tolerated treatment well ?Patient left: in chair;with call bell/phone within reach;with chair alarm set ? ?OT Visit Diagnosis: Unsteadiness on feet (R26.81);Other abnormalities of gait and mobility (R26.89);Muscle weakness (generalized) (M62.81)  ?              ?Time: 6712-4580 ?OT Time Calculation (min): 19 min ?Charges:  OT General Charges ?$OT Visit: 1 Visit ?OT Evaluation ?$OT Eval Low Complexity: 1 Low ? ?Lynnda Child, OTD, OTR/L ?Acute Rehab ?[phone] ? ?Kaylyn Lim ?04/11/2021, 2:04 PM ?

## 2021-04-11 NOTE — Progress Notes (Addendum)
? ?Subjective: ? ?No acute events overnight. Patient states wasn't able to work long with PT yesterday due to interruption, still not moving around much. Not endorsing SOB on room air while in room. ? ?States he was able to have bowel movement yesterday. No other complaints. ? ?Objective: ? ?Vital signs in last 24 hours: ?Vitals:  ? 04/10/21 1456 04/10/21 2248 04/11/21 0600 04/11/21 0730  ?BP: (!) 146/79 115/66 102/74 126/75  ?Pulse: 82 80 71 62  ?Resp: '17 18 18 18  '$ ?Temp: 99.1 ?F (37.3 ?C) 98.8 ?F (37.1 ?C) 98.6 ?F (37 ?C) 98.2 ?F (36.8 ?C)  ?TempSrc: Oral Oral Oral Oral  ?SpO2: 95% 99% 95% 90%  ?Weight:      ?Height:      ? ?Weight change:  ? ?Intake/Output Summary (Last 24 hours) at 04/11/2021 1207 ?Last data filed at 04/11/2021 5102 ?Gross per 24 hour  ?Intake 1200 ml  ?Output 3175 ml  ?Net -1975 ml  ? ?Physical Exam: ?Constitutional: elderly man sitting comfortably in bed, conversant, in NAD ?HEENT: normocephalic, atraumatic, moist mucus membranes ?Cardio: normal rate and rhythm ?Pulm: no increased work of breathing on room air, no crackles or wheezing ?Abd: non-distended, non-tender, +bowel sounds ?Extremities: warm and dry, normal skin turgor to bilateral LE ?Neuro: alert and oriented x3, answering questions appropriately ?Psych: normal affect ? ?Assessment/Plan: ? ?Principal Problem: ?  Pulmonary embolism ([place]) ?Active Problems: ?  Prostate cancer ([place]) ?  Acute pulmonary embolism ([place]) ?  AKI (acute kidney injury) ([place]) ?  CAP (community acquired pneumonia) ?  Hypokalemia ? ?Patient Summary: ?John Schaefer is a 66 y.o. with a pertinent PMH of prostate cancer s/p prostatectomy and adjuvant radiation therapy, hx of obesity, htn, and DMII and concern for pulmonary htn, with improving acute dyspnea secondary to acute pulmonary embolism. ?  ?#Acute hypoxic respiratory failure ?#Acute Unprovoked Submassive Pulmonary Embolism ?Patient not endorsing SOB while on room air with recorded spO2 of 90%. He was seen by PT  today, O2 requirement of 3L was identified with ambulation, will need to discharge with home oxygen. PT also recommending discharge to SNF, working with SW to obtain placement. Patient remains afebrile with no acute signs of infection, infiltrates on imaging likely related to the PE vs concurrent infection.  ?-continue Eliquis 10 mg BID x 7 days followed by Eliquis 5 mg BID ?-d/c telemetry ?-d/c continuous pulse ox.  ?-appreciate PT/OT recs ?-pending SNF placement ?  ?#Acute Kidney Injury ?Patient with no prior hx of chronic kidney disease. Baseline creatinine unclear, ~1.3-1.4. Unclear what may have caused AKI. Kidney function continues to improve, creatinine downtrending, today 1.41 from 2 (3/25). He is euvolemic on exam and has maintained good urinary output.  ?-avoid nephrotoxins  ?-daily BMP ?  ?#Hypertension ?Patient has hx of hypertension. Home meds include Norvasc 10 mg qd, HCTZ 25 mg qd, and losartan 100 mg daily. Patient currently normotensive so we will continue to hold his antihypertensives.  ?- continue to hold ?  ?#Diabetes Mellitus II ?A1c this admission at 6.6. His home medication is Metformin 1000 mg BID.  ?-SSI with meals and at bedtime ?  ?#Hyperlipidemia ?-Continue home lipitor 20 mg daily ?  ?#Hypokalemia; resolved ?  ?DVT prophx: Eliquis ?Diet: Heart healthy ?Bowel: PRN ?Code: Full ? ? LOS: 3 day  ? ?Rodriguez-Teodoro, Quintella Reichert, Medical Student ?04/11/2021, 12:07 PM  ? ?Attestation for Student Documentation: ? ?I personally was present and performed or re-performed the history, physical exam and medical decision-making activities of this service  and have verified that the service and findings are accurately documented in the student?s note. ? Rosezetta Schlatter, MD ?04/11/2021, 1:42 PM  ?

## 2021-04-13 LAB — CULTURE, BLOOD (ROUTINE X 2)
Culture: NO GROWTH
Culture: NO GROWTH
Special Requests: ADEQUATE
Special Requests: ADEQUATE

## 2021-06-05 IMAGING — PT NM PET TUM IMG SKULL BASE T - THIGH
5 series · 25 of 25 positions shown · non-contrast
Comparison: Bone scan 08/29/2019 prostate MRI 03/10/2019

CLINICAL DATA: Prostate carcinoma with biochemical recurrence.

EXAM:
NUCLEAR MEDICINE PET SKULL BASE TO THIGH
TECHNIQUE: 9.2 mCi F18 Piflufolastat (Pylarify) was injected intravenously.
Full-ring PET imaging was performed from the skull base to thigh
after the radiotracer. CT data was obtained and used for attenuation
correction and anatomic localization.

[Series 3: pet sk_thigh ac · axial · 5.0mm · 4.07mm/px · z∈[-1322,-414]mm · 7 of 228 slices shown]
[im 1/228]
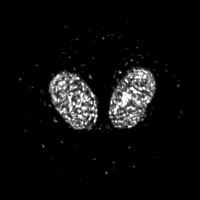
[im 38/228]
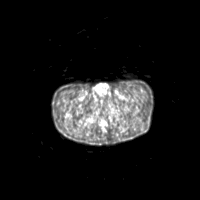
[im 76/228]
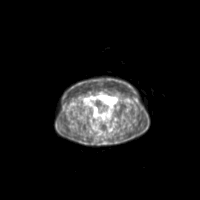
[im 114/228]
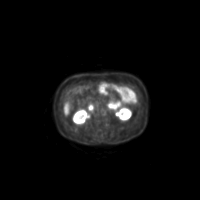
[im 152/228]
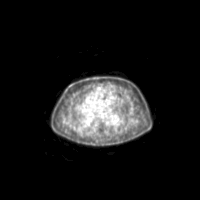
[im 190/228]
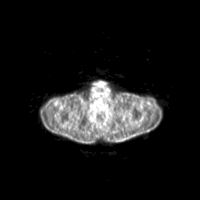
[im 228/228]
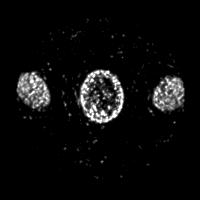

[Series 5: pet sk_thigh nac · axial · 5.0mm · 4.07mm/px · z∈[-1322,-414]mm · 8 of 228 slices shown]
[im 1/228]
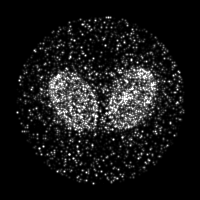
[im 33/228]
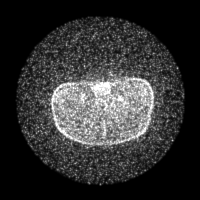
[im 65/228]
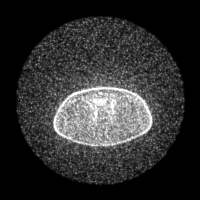
[im 98/228]
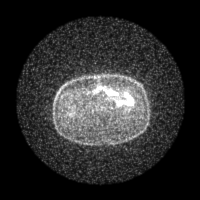
[im 130/228]
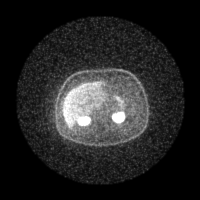
[im 163/228]
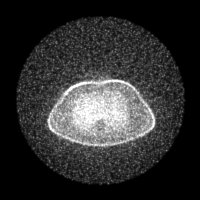
[im 195/228]
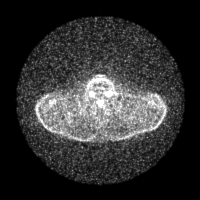
[im 228/228]
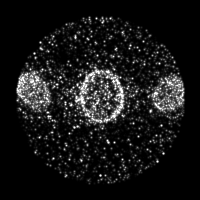

[Series 8: ct sk_thigh 5.0 br59 lung_bone · axial · 5.0mm · 0.68mm/px · z∈[-784,-568]mm · 2 of 55 slices shown]
[im 1/55]
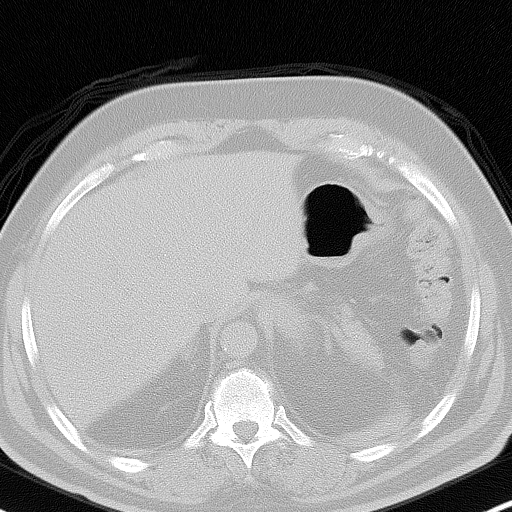
[im 55/55]
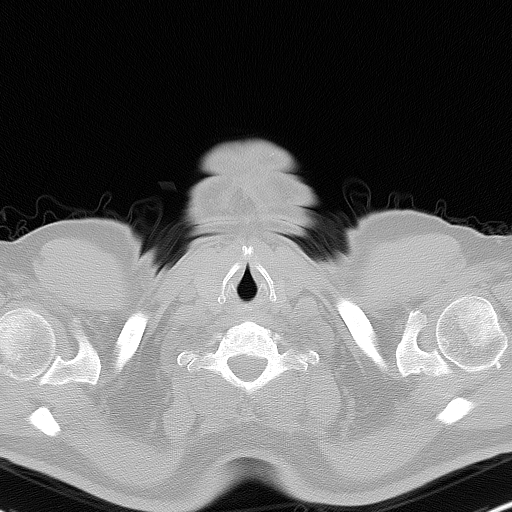

[Series 603: <mip collection> · coronal · 1.88mm/px · 1 of 32 slices shown]
[im 1/32]
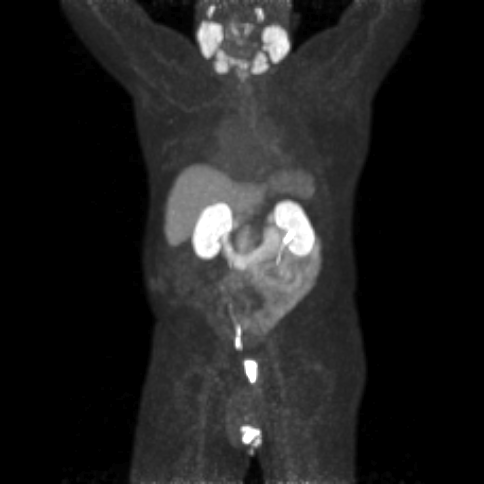

[Series 605: range-ct sk_thigh 5.0 bf37-tra-<alpha range> · 7 of 216 slices shown]
[im 1/216]
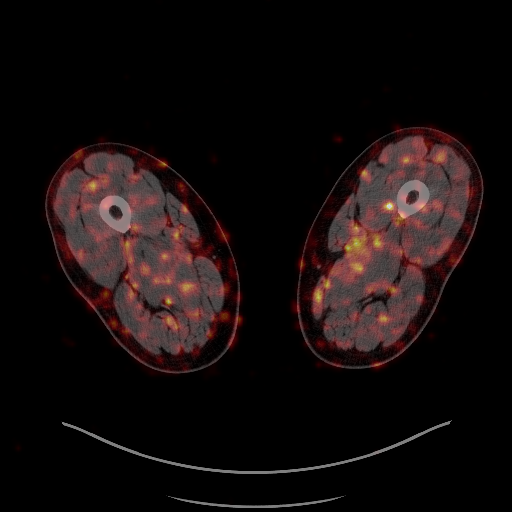
[im 36/216]
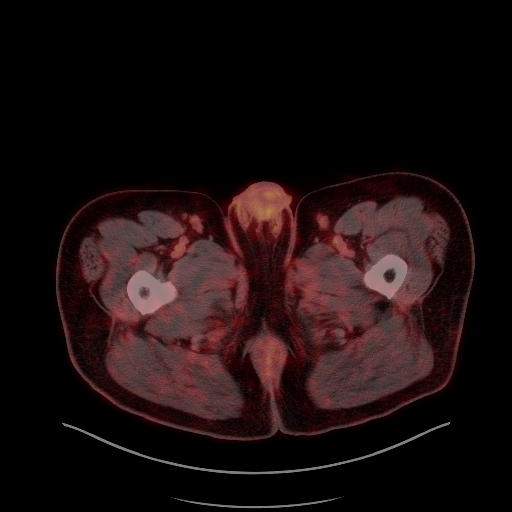
[im 72/216]
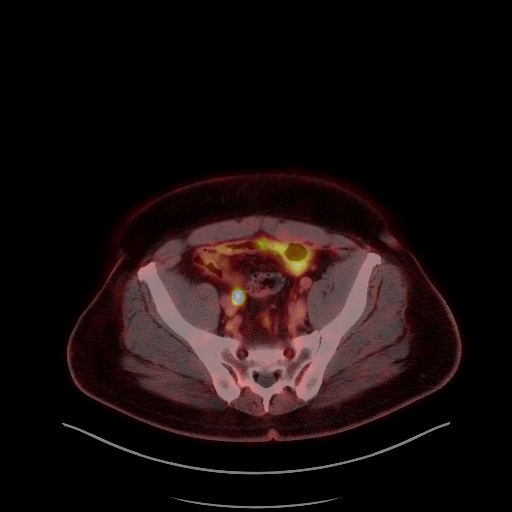
[im 108/216]
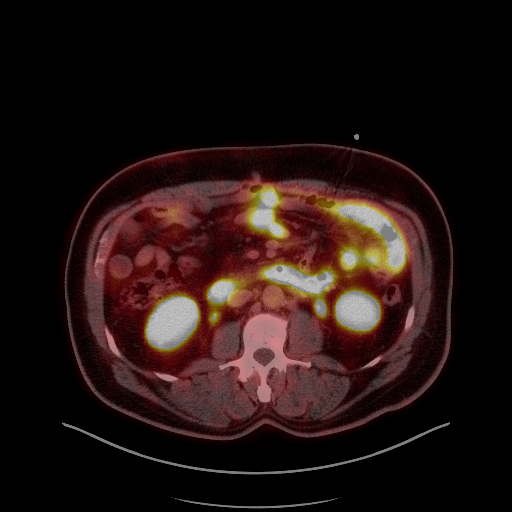
[im 144/216]
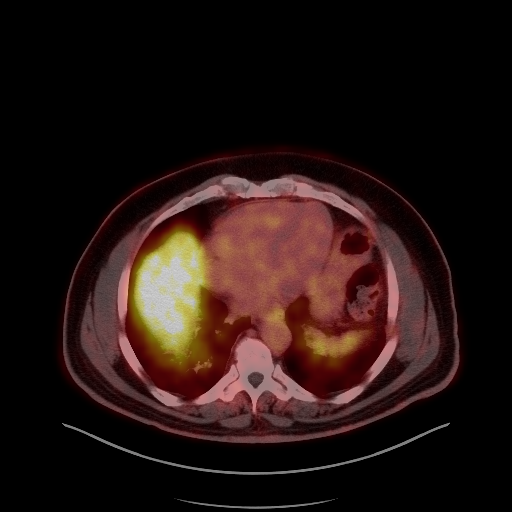
[im 180/216]
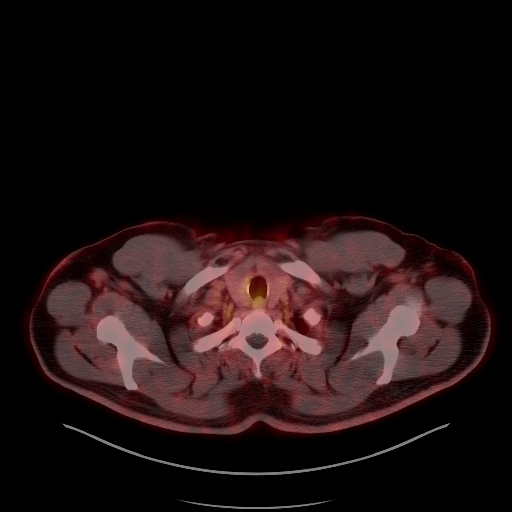
[im 216/216]
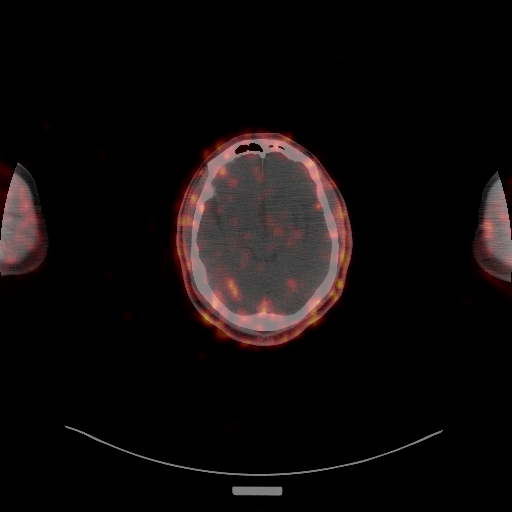

[25 of 25 positions shown; findings below may reference images not displayed]

FINDINGS: NECK

No abnormal activity in the neck.

Incidental CT finding: None

CHEST

No radiotracer accumulation within mediastinal or hilar lymph nodes.
No suspicious pulmonary nodules on the CT scan.

Incidental CT finding: None

ABDOMEN/PELVIS

Prostate: No focal activity in prostatectomy bed.

Lymph nodes: Single radiotracer avid lymph node in the presacral
space anterior to the S1 vertebral body measures 12 mm (image 154/CT
series 4) with SUV max equal 5.4.

No radiotracer avid periaortic retroperitoneal nodes.

Liver: No evidence of liver metastasis

Incidental CT finding: Atherosclerotic calcification of the aorta.

SKELETON

No focal  activity to suggest skeletal metastasis.
IMPRESSION: 1. Single radiotracer avid lymph node in the presacral space
anterior to the S1 vertebral body is concerning for prostate cancer
nodal metastasis.
2. No Retroperitoneal periaortic nodal metastasis.
3. No visceral metastasis or skeletal metastasis.
4. No evidence of local recurrence of prostatectomy bed.

## 2021-09-20 ENCOUNTER — Other Ambulatory Visit: Payer: Self-pay | Admitting: Physician Assistant

## 2021-09-20 DIAGNOSIS — Z86711 Personal history of pulmonary embolism: Secondary | ICD-10-CM

## 2021-10-12 ENCOUNTER — Ambulatory Visit
Admission: RE | Admit: 2021-10-12 | Discharge: 2021-10-12 | Disposition: A | Discharge status: Home / Self Care | Payer: Medicare Other | Source: Ambulatory Visit | Attending: Physician Assistant | Admitting: Physician Assistant

## 2021-10-12 DIAGNOSIS — Z86711 Personal history of pulmonary embolism: Secondary | ICD-10-CM

## 2021-10-12 MED ORDER — IOPAMIDOL (ISOVUE-370) INJECTION 76%
75.0000 mL | Freq: Once | INTRAVENOUS | Status: AC | PRN
  Administered 2021-10-12: 75 mL via INTRAVENOUS

## 2021-12-13 ENCOUNTER — Other Ambulatory Visit: Payer: Self-pay | Admitting: Physician Assistant

## 2021-12-13 DIAGNOSIS — Z136 Encounter for screening for cardiovascular disorders: Secondary | ICD-10-CM

## 2021-12-13 DIAGNOSIS — Z87891 Personal history of nicotine dependence: Secondary | ICD-10-CM

## 2021-12-26 ENCOUNTER — Other Ambulatory Visit: Payer: Medicare Other

## 2022-01-03 ENCOUNTER — Ambulatory Visit: Payer: Medicare Other | Admitting: Podiatry

## 2022-01-13 ENCOUNTER — Ambulatory Visit
Admission: RE | Admit: 2022-01-13 | Discharge: 2022-01-13 | Disposition: A | Discharge status: Home / Self Care | Payer: Medicare Other | Source: Ambulatory Visit | Attending: Physician Assistant | Admitting: Physician Assistant

## 2022-01-13 DIAGNOSIS — Z87891 Personal history of nicotine dependence: Secondary | ICD-10-CM

## 2022-01-13 DIAGNOSIS — Z136 Encounter for screening for cardiovascular disorders: Secondary | ICD-10-CM

## 2022-01-19 ENCOUNTER — Ambulatory Visit (INDEPENDENT_AMBULATORY_CARE_PROVIDER_SITE_OTHER): Payer: Medicare Other | Admitting: Podiatry

## 2022-01-19 DIAGNOSIS — M79674 Pain in right toe(s): Secondary | ICD-10-CM | POA: Diagnosis not present

## 2022-01-19 DIAGNOSIS — B351 Tinea unguium: Secondary | ICD-10-CM

## 2022-01-19 DIAGNOSIS — E1142 Type 2 diabetes mellitus with diabetic polyneuropathy: Secondary | ICD-10-CM | POA: Diagnosis not present

## 2022-01-19 DIAGNOSIS — M79675 Pain in left toe(s): Secondary | ICD-10-CM

## 2022-01-19 DIAGNOSIS — B353 Tinea pedis: Secondary | ICD-10-CM | POA: Diagnosis not present

## 2022-01-19 MED ORDER — KETOCONAZOLE 2 % EX CREA: 1.0000 | TOPICAL_CREAM | Freq: Every day | CUTANEOUS | 0 refills | Status: AC

## 2022-01-19 NOTE — Progress Notes (Signed)
  Subjective:  Patient ID: John Schaefer, male    DOB: Dec 25, 1955,  MRN: [id number]  Chief Complaint  Patient presents with   nail care    [facility]    67 y.o. male presents with the above complaint. History confirmed with patient. Patient presenting with pain related to dystrophic thickened elongated nails. Patient is unable to trim own nails related to nail dystrophy and/or mobility issues. Patient does have a history of T2DM.  Has also noticed some redness and dry flaking skin on the bottom of both feet.  Has not tried any antifungals yet.  Objective:  Physical Exam: warm, good capillary refill nail exam onychomycosis of the toenails, onycholysis, and dystrophic nails DP pulses palpable, PT pulses palpable, and protective sensation intact Left Foot:  Pain with palpation of nails due to elongation and dystrophic growth. Xerosis of the skin and mild red spotty rash  Right Foot: Pain with palpation of nails due to elongation and dystrophic growth. Xerosis of the skin and mild red spotty rash   Assessment:   1. Pain due to onychomycosis of toenails of both feet   2. Tinea pedis of both feet   3. DM type 2 with diabetic peripheral neuropathy (John Schaefer)      Plan:  Patient was evaluated and treated and all questions answered.  Patient educated on diabetes. Discussed proper diabetic foot care and discussed risks and complications of disease. Educated patient in depth on reasons to return to the office immediately should he/she discover anything concerning or new on the feet. All questions answered. Discussed proper shoes as well.   #tinea pedis Discussed the etiology and treatment options for tinea pedis.  Discussed topical and oral treatment.  Recommended topical treatment with 2% ketoconazole cream.  This was sent to the patient's pharmacy.  Also discussed appropriate foot hygiene, use of antifungal spray such as Tinactin in shoes, as well as cleaning her foot surfaces such as showers and bathroom  floors with bleach.  #Onychomycosis with pain  -Nails palliatively debrided as below. -Educated on self-care  Procedure: Nail Debridement Rationale: Pain Type of Debridement: manual, sharp debridement. Instrumentation: Nail nipper, rotary burr. Number of Nails: 10  Return in about 3 months (around 04/20/2022) for John Schaefer.         Everitt Amber, DPM [facility]

## 2022-04-20 ENCOUNTER — Ambulatory Visit (INDEPENDENT_AMBULATORY_CARE_PROVIDER_SITE_OTHER): Payer: Medicare Other | Admitting: Podiatry

## 2022-04-20 DIAGNOSIS — M79675 Pain in left toe(s): Secondary | ICD-10-CM | POA: Diagnosis not present

## 2022-04-20 DIAGNOSIS — B353 Tinea pedis: Secondary | ICD-10-CM

## 2022-04-20 DIAGNOSIS — M79674 Pain in right toe(s): Secondary | ICD-10-CM

## 2022-04-20 DIAGNOSIS — E1142 Type 2 diabetes mellitus with diabetic polyneuropathy: Secondary | ICD-10-CM

## 2022-04-20 DIAGNOSIS — B351 Tinea unguium: Secondary | ICD-10-CM

## 2022-04-20 MED ORDER — CICLOPIROX 8 % EX SOLN: Freq: Every day | CUTANEOUS | 0 refills | Status: AC

## 2022-04-20 MED ORDER — KETOCONAZOLE 2 % EX CREA: 1.0000 | TOPICAL_CREAM | Freq: Every day | CUTANEOUS | 0 refills | Status: AC

## 2022-04-20 NOTE — Progress Notes (Signed)
  Subjective:  Patient ID: John Schaefer, male    DOB: May 14, 1955,  MRN: [id number]  Chief Complaint  Patient presents with   Nail Problem    [name]    67 y.o. male presents with the above complaint. History confirmed with patient. Patient presenting with pain related to dystrophic thickened elongated nails. Patient is unable to trim own nails related to nail dystrophy and/or mobility issues. Patient does have a history of T2DM.  Has also noticed some redness and dry flaking skin on the bottom of both feet.  Has not tried any antifungals yet.  Objective:  Physical Exam: warm, good capillary refill nail exam onychomycosis of the toenails, onycholysis, and dystrophic nails DP pulses palpable, PT pulses palpable, and protective sensation intact Left Foot:  Pain with palpation of nails due to elongation and dystrophic growth. Xerosis of the skin and mild red spotty rash  Right Foot: Pain with palpation of nails due to elongation and dystrophic growth. Xerosis of the skin and mild red spotty rash   Assessment:   No diagnosis found.    Plan:  Patient was evaluated and treated and all questions answered.   #tinea pedis Discussed the etiology and treatment options for tinea pedis.  Discussed topical and oral treatment.  Recommended topical treatment with 2% ketoconazole cream.  This was sent to the patient's pharmacy.  Also discussed appropriate foot hygiene, use of antifungal spray such as Tinactin in shoes, as well as cleaning her foot surfaces such as showers and bathroom floors with bleach.  #onychomycosis - erx for penlac 8% topical solution applied 1x daily to all nails  #Onychomycosis with pain  -Nails palliatively debrided as below. -Educated on self-care  Procedure: Nail Debridement Rationale: Pain Type of Debridement: manual, sharp debridement. Instrumentation: Nail nipper, rotary burr. Number of Nails: 10  Return in about 3 months (around 07/20/2022) for [facility].         Everitt Amber, DPM [facility]

## 2022-06-14 IMAGING — CT CT ANGIO CHEST
2 of 7 series · 16 of 46 positions shown · IV contrast (APPLIED)
Comparison: 04/08/2021, 03/30/2020

CLINICAL DATA: Shortness of breath for several days, prostate
cancer

EXAM:
CT ANGIOGRAPHY CHEST WITH CONTRAST
TECHNIQUE: Multidetector CT imaging of the chest was performed using the
standard protocol during bolus administration of intravenous
contrast. Multiplanar CT image reconstructions and MIPs were
obtained to evaluate the vascular anatomy.

[Series 7: thins · axial · 0.74mm/px · z∈[+1249,+1482]mm · 13 of 375 slices shown]
[im 21/375  lung]
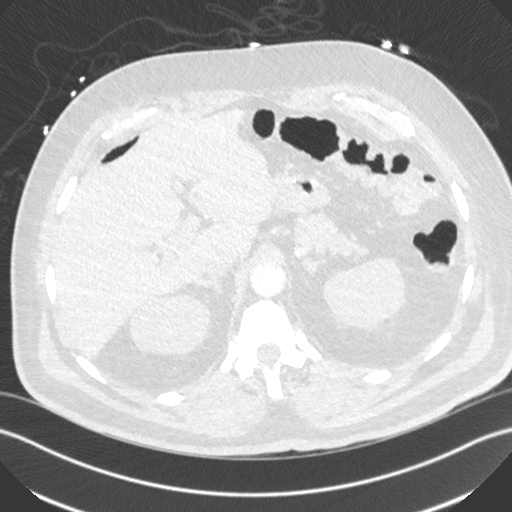
[im 42/375  soft-tissue]
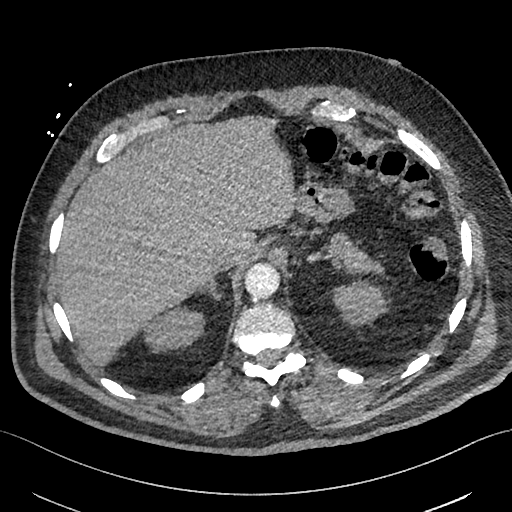
[im 84/375  lung]
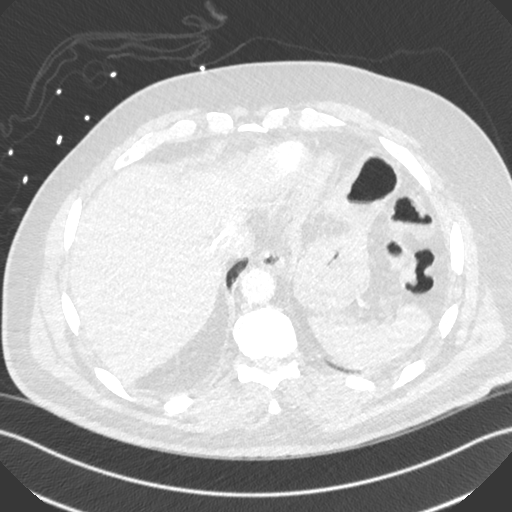
[im 104/375  soft-tissue]
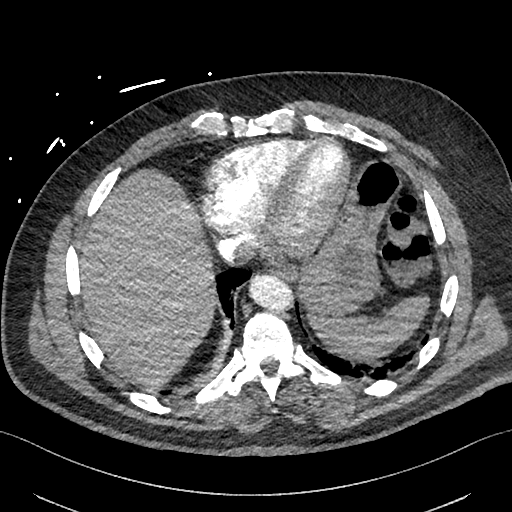
[im 125/375  lung]
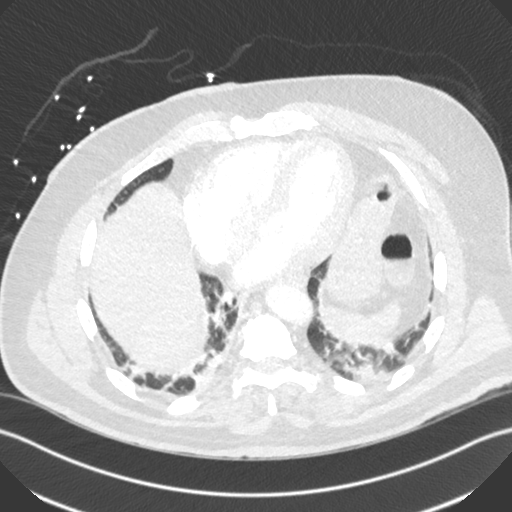
[im 167/375  soft-tissue]
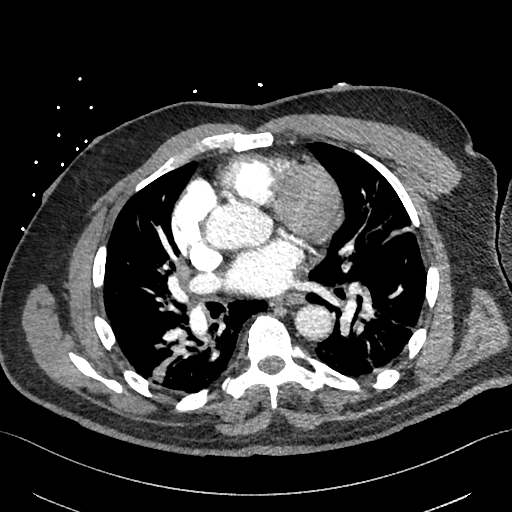
[im 188/375  lung]
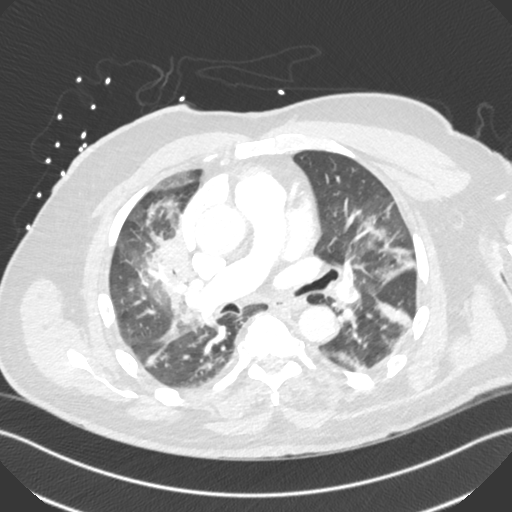
[im 208/375  soft-tissue]
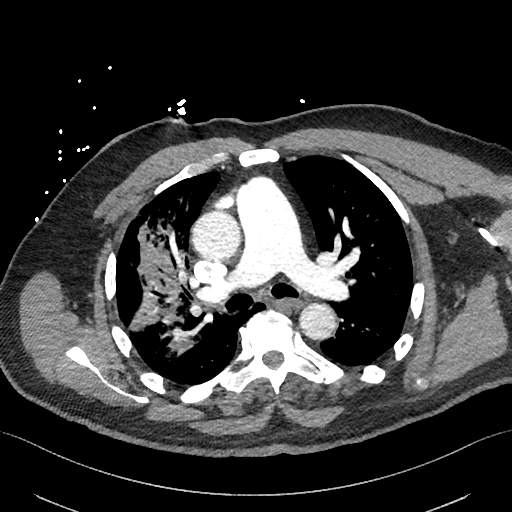
[im 250/375  lung]
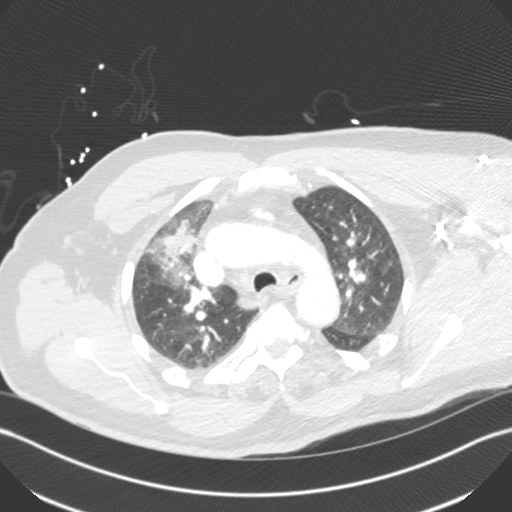
[im 271/375  soft-tissue]
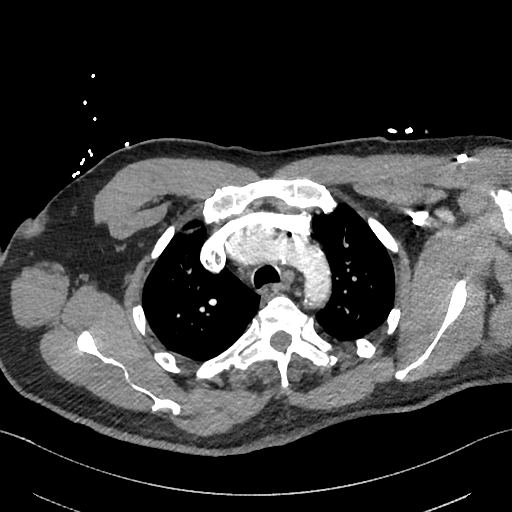
[im 291/375  lung]
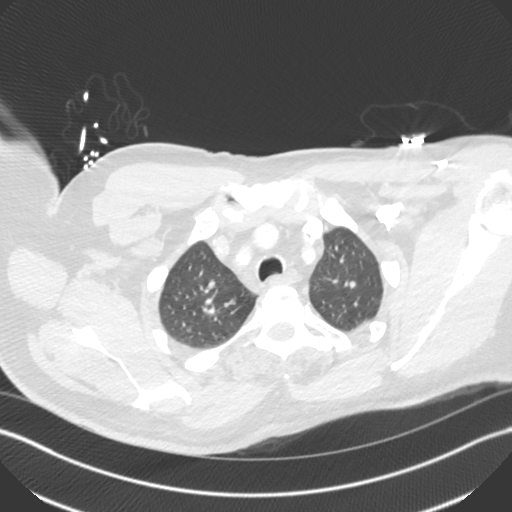
[im 333/375  soft-tissue]
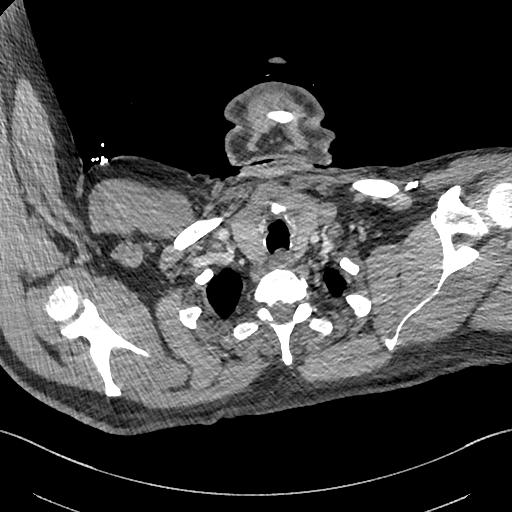
[im 354/375  lung]
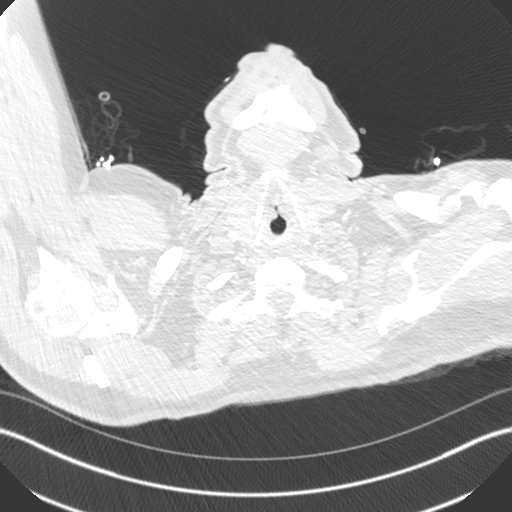

[Series 8: cor · coronal · 0.55mm/px · 3 of 155 slices shown]
[im 39/155  soft-tissue]
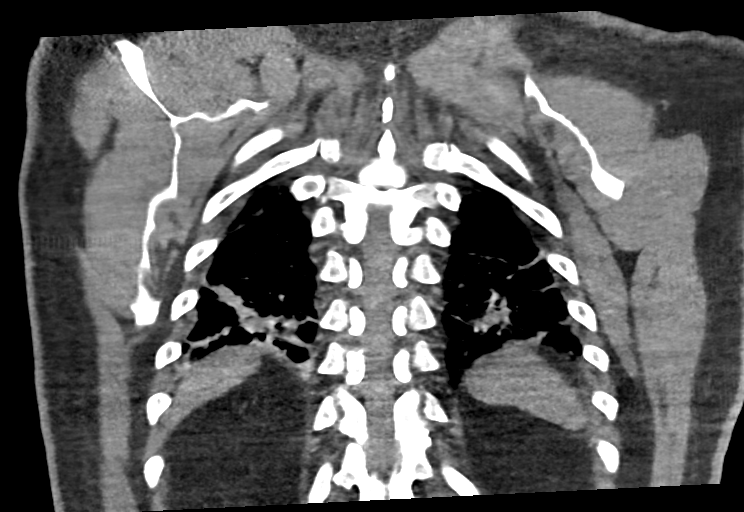
[im 78/155  soft-tissue]
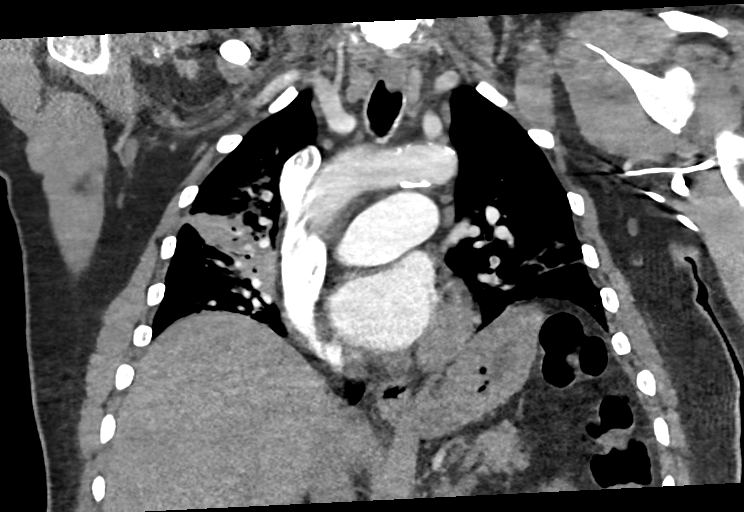
[im 116/155  soft-tissue]
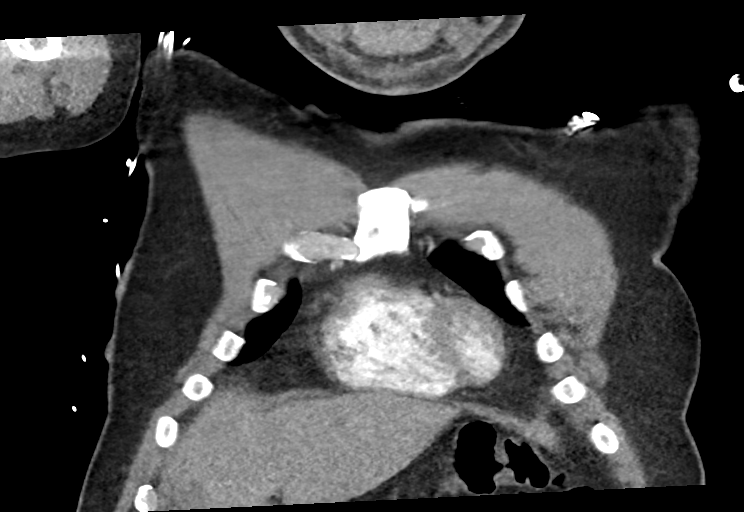

[16 of 46 positions shown; findings below may reference images not displayed]

RADIATION DOSE REDUCTION: This exam was performed according to the
departmental dose-optimization program which includes automated
exposure control, adjustment of the mA and/or kV according to
patient size and/or use of iterative reconstruction technique.

CONTRAST:  100mL OMNIPAQUE IOHEXOL 350 MG/ML SOLN
FINDINGS: Cardiovascular: This is a technically adequate evaluation of the
pulmonary vasculature. There are bilateral segmental pulmonary
emboli most pronounced within the right upper and left lower lobes.
Equivocal segmental emboli within the right lower lobe, evaluation
limited by respiratory motion and adjacent lung consolidation. There
is mild clot burden. No evidence of central pulmonary emboli. The
RV/LV ratio is calculated at 1.2, though there is no straightening
or bowing of the interventricular septum and the configuration of
the cardiac chambers appear similar to prior abdominal CT
04/15/2019. This may be related to chronic right ventricular
dilatation due to pulmonary arterial hypertension.

No evidence of pericardial effusion. Ectasia of the thoracic aorta
without aneurysm or dissection. Mild atherosclerosis of the aortic
arch and coronary vasculature.

Mediastinum/Nodes: No enlarged mediastinal, hilar, or axillary lymph
nodes. Thyroid gland, trachea, and esophagus demonstrate no
significant findings.

Lungs/Pleura: Multifocal areas of dense consolidation are
identified, greatest in the right upper and bilateral lower lobes.
The appearance is most consistent with multifocal pneumonia, though
underlying pulmonary infarct cannot be excluded in light of
pulmonary emboli. No effusion or pneumothorax. Central airways are
patent.

Upper Abdomen: No acute abnormality.

Musculoskeletal: There are no acute or destructive bony lesions.
Reconstructed images demonstrate no additional findings.

Review of the MIP images confirms the above findings.
IMPRESSION: 1. Acute segmental pulmonary emboli involving the right upper and
left lower lobes. RV/LV ratio is elevated measuring 1.2, despite
only mild clot burden. Based on the similarity to prior
examinations, this may be due to underlying chronic pulmonary
hypertension rather than acute right heart strain. Please correlate
with clinical presentation and patient status.
2. Dense areas of multifocal consolidation most pronounced in the
right upper lobe and bilateral lower lobes. Favor multifocal
pneumonia over pulmonary infarcts.
3.  Aortic Atherosclerosis ([id number]).

Critical Value/emergent results were called by telephone at the time
of interpretation on 04/08/2021 at [DATE] to provider KYM RUIZ
, who verbally acknowledged these results.
